# Patient Record
Sex: Female | Born: 1950
Health system: Southern US, Community
[De-identification: ages and names within clinical notes are randomized; demographics above are authoritative.]

## PROBLEM LIST (undated history)

## (undated) DIAGNOSIS — I2510 Atherosclerotic heart disease of native coronary artery without angina pectoris: Secondary | ICD-10-CM

## (undated) DIAGNOSIS — K5790 Diverticulosis of intestine, part unspecified, without perforation or abscess without bleeding: Secondary | ICD-10-CM

## (undated) DIAGNOSIS — B019 Varicella without complication: Secondary | ICD-10-CM

## (undated) DIAGNOSIS — E559 Vitamin D deficiency, unspecified: Secondary | ICD-10-CM

## (undated) DIAGNOSIS — I10 Essential (primary) hypertension: Secondary | ICD-10-CM

## (undated) DIAGNOSIS — Z972 Presence of dental prosthetic device (complete) (partial): Secondary | ICD-10-CM

## (undated) DIAGNOSIS — M1990 Unspecified osteoarthritis, unspecified site: Secondary | ICD-10-CM

## (undated) HISTORY — DX: Vitamin D deficiency, unspecified: E55.9

## (undated) HISTORY — DX: Diverticulosis of intestine, part unspecified, without perforation or abscess without bleeding: K57.90

## (undated) HISTORY — DX: Varicella without complication: B01.9

## (undated) HISTORY — DX: Atherosclerotic heart disease of native coronary artery without angina pectoris: I25.10

## (undated) HISTORY — PX: TUBAL LIGATION: SHX77

---

## 2012-03-16 LAB — LIPID PANEL
Cholesterol: 248 mg/dL — AB (ref 0–200)
HDL: 104 mg/dL — AB (ref 35–70)
LDL Cholesterol: 114 mg/dL
LDL/HDL RATIO: 2.4
Triglycerides: 149 mg/dL (ref 40–160)

## 2012-03-16 LAB — HM MAMMOGRAPHY: HM Mammogram: NORMAL

## 2012-03-16 LAB — HM COLONOSCOPY: HM COLON: NORMAL

## 2014-11-22 ENCOUNTER — Ambulatory Visit (INDEPENDENT_AMBULATORY_CARE_PROVIDER_SITE_OTHER): Payer: Medicare Other | Admitting: Nurse Practitioner

## 2014-11-22 ENCOUNTER — Encounter: Payer: Self-pay | Admitting: Nurse Practitioner

## 2014-11-22 ENCOUNTER — Encounter (INDEPENDENT_AMBULATORY_CARE_PROVIDER_SITE_OTHER): Payer: Self-pay

## 2014-11-22 VITALS — BP 134/80 | HR 64 | Temp 97.8°F | Resp 12 | Ht 62.0 in | Wt 160.0 lb

## 2014-11-22 DIAGNOSIS — Z418 Encounter for other procedures for purposes other than remedying health state: Secondary | ICD-10-CM

## 2014-11-22 DIAGNOSIS — Z23 Encounter for immunization: Secondary | ICD-10-CM

## 2014-11-22 DIAGNOSIS — Z0000 Encounter for general adult medical examination without abnormal findings: Secondary | ICD-10-CM

## 2014-11-22 DIAGNOSIS — Z1239 Encounter for other screening for malignant neoplasm of breast: Secondary | ICD-10-CM

## 2014-11-22 DIAGNOSIS — Z299 Encounter for prophylactic measures, unspecified: Secondary | ICD-10-CM

## 2014-11-22 LAB — TSH: TSH: 2.43 u[IU]/mL (ref 0.35–4.50)

## 2014-11-22 LAB — COMPREHENSIVE METABOLIC PANEL
ALK PHOS: 62 U/L (ref 39–117)
ALT: 14 U/L (ref 0–35)
AST: 17 U/L (ref 0–37)
Albumin: 4.3 g/dL (ref 3.5–5.2)
BILIRUBIN TOTAL: 0.4 mg/dL (ref 0.2–1.2)
BUN: 12 mg/dL (ref 6–23)
CO2: 27 mEq/L (ref 19–32)
Calcium: 9.6 mg/dL (ref 8.4–10.5)
Chloride: 106 mEq/L (ref 96–112)
Creatinine, Ser: 0.73 mg/dL (ref 0.40–1.20)
GFR: 84.95 mL/min (ref >60.00)
GLUCOSE: 86 mg/dL (ref 70–99)
Potassium: 4.2 mEq/L (ref 3.5–5.1)
Sodium: 139 mEq/L (ref 135–145)
TOTAL PROTEIN: 6.9 g/dL (ref 6.0–8.3)

## 2014-11-22 LAB — CBC WITH DIFFERENTIAL/PLATELET
BASOS ABS: 0 10*3/uL (ref 0.0–0.1)
Basophils Relative: 0.5 % (ref 0.0–3.0)
Eosinophils Absolute: 0.1 10*3/uL (ref 0.0–0.7)
Eosinophils Relative: 0.9 % (ref 0.0–5.0)
HCT: 40.6 % (ref 36.0–46.0)
HEMOGLOBIN: 14.1 g/dL (ref 12.0–15.0)
Lymphocytes Relative: 44.1 % (ref 12.0–46.0)
Lymphs Abs: 2.4 10*3/uL (ref 0.7–4.0)
MCHC: 34.8 g/dL (ref 30.0–36.0)
MCV: 87.5 fl (ref 78.0–100.0)
Monocytes Absolute: 0.5 10*3/uL (ref 0.1–1.0)
Monocytes Relative: 8.3 % (ref 3.0–12.0)
Neutro Abs: 2.5 10*3/uL (ref 1.4–7.7)
Neutrophils Relative %: 46.2 % (ref 43.0–77.0)
Platelets: 247 10*3/uL (ref 150.0–400.0)
RBC: 4.64 Mil/uL (ref 3.87–5.11)
RDW: 12.1 % (ref 11.5–15.5)
WBC: 5.5 10*3/uL (ref 4.0–10.5)

## 2014-11-22 LAB — HEMOGLOBIN A1C: HEMOGLOBIN A1C: 5.3 % (ref 4.6–6.5)

## 2014-11-22 LAB — LIPID PANEL
CHOLESTEROL: 217 mg/dL — AB (ref 0–200)
HDL: 86.5 mg/dL (ref >39.00)
LDL Cholesterol: 107 mg/dL — ABNORMAL HIGH (ref 0–99)
NonHDL: 130.5
TRIGLYCERIDES: 117 mg/dL (ref 0.0–149.0)
Total CHOL/HDL Ratio: 3
VLDL: 23.4 mg/dL (ref 0.0–40.0)

## 2014-11-22 NOTE — Assessment & Plan Note (Addendum)
Discussed acute and chronic issues. Reviewed health maintenance measures, PFSHx, and immunizations. Obtain routine labs TSH, Lipid panel, CBC w/ diff, A1c, and CMET. Pneumovax was given today. Pt unsure if needing welcome to medicare visit. Referral for mammogram. FU in 1 year or sooner.

## 2014-11-22 NOTE — Progress Notes (Signed)
Subjective:    Patient ID: Jill Arellano, female    DOB: 04/24/1951, 64 y.o.   MRN: [id number]  HPI  Ms. Jill Arellano is a 64 yo female establishing care.   1) Health Maintenance-   Diet- Cooks and goes out to eat, Cut down on pizza and red meat   Exercise- Walks when nice weather, most mornings 1 hour  Immunizations- Refused flu, would like pneumonia   Mammogram- 2013  Pap- Does not remember   Bone Density- Had approx 2011   Colonoscopy- 2013  PSA- N/A  Eye Exam- No up to date- last 2013  Dental Exam- 2013, due for visit   2) Chronic Problems-  Denies  3) Acute Problems-  Denies   Review of Systems  Constitutional: Positive for diaphoresis. Negative for fever, chills and fatigue.       Hot flashes  HENT: Negative for tinnitus and trouble swallowing.   Eyes: Negative for visual disturbance.  Respiratory: Negative for chest tightness, shortness of breath and wheezing.   Cardiovascular: Negative for chest pain, palpitations and leg swelling.  Gastrointestinal: Negative for nausea, vomiting, diarrhea, constipation and abdominal distention.  Genitourinary: Negative for dysuria.  Musculoskeletal: Negative for back pain and neck pain.  Skin: Negative for rash.  Allergic/Immunologic: Negative for environmental allergies and food allergies.  Neurological: Negative for dizziness, weakness, numbness and headaches.  Hematological: Does not bruise/bleed easily.  Psychiatric/Behavioral: Positive for sleep disturbance. Negative for suicidal ideas. The patient is not nervous/anxious.        Sleep habits changed due to menopause   Past Medical History  Diagnosis Date  . Chicken pox     History   Social History  . Marital Status: Divorced    Spouse Name: N/A    Number of Children: N/A  . Years of Education: N/A   Occupational History  . Not on file.   Social History Main Topics  . Smoking status: Former Games developermoker  . Smokeless tobacco: Former NeurosurgeonUser    Quit date: 08/10/1995  .  Alcohol Use: 0.6 oz/week    1 Not specified per week  . Drug Use: No  . Sexual Activity: Not Currently   Other Topics Concern  . 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  Family History  Problem Relation Age of Onset  . Heart disease Mother     CHF  . Cancer Sister     lung  . Cancer Brother     colon    No Known Allergies  No current outpatient prescriptions on file prior to visit.   No current facility-administered medications on file prior to visit.      Objective:   Physical Exam  Constitutional: She is oriented to person, place, and time. She appears well-developed and well-nourished. No distress.  HENT:  Head: Normocephalic and atraumatic.  Right Ear: External ear normal.  Left Ear: External ear normal.  Eyes: Conjunctivae and EOM are normal. Pupils are equal, round, and reactive to light. Right eye exhibits no discharge. Left eye exhibits no discharge. No scleral icterus.  Neck: Normal range of motion. Neck  supple. No thyromegaly present.  Cardiovascular: Normal rate, regular rhythm, normal heart sounds and intact distal pulses.  Exam reveals no gallop and no friction rub.   No murmur heard. Pulmonary/Chest: Effort normal and breath sounds normal. No respiratory distress. She has no wheezes. She has no rales. She exhibits no tenderness.  Abdominal: Soft. Bowel sounds are normal. She exhibits no distension and no mass. There is no tenderness. There is no rebound and no guarding.  Musculoskeletal: Normal range of motion. She exhibits no edema or tenderness.  Lymphadenopathy:    She has no cervical adenopathy.  Neurological: She is alert and oriented to person, place, and time. No cranial nerve deficit.  She exhibits normal muscle tone. Coordination normal.  Skin: Skin is warm and dry. No rash noted. She is not diaphoretic.  Psychiatric: She has a normal mood and affect. Her behavior is normal. Judgment and thought content normal.      Assessment & Plan:

## 2014-11-22 NOTE — Progress Notes (Signed)
Pre visit review using our clinic review tool, if applicable. No additional management support is needed unless otherwise documented below in the visit note. 

## 2014-11-22 NOTE — Addendum Note (Signed)
Addended by: Carollee LeitzSS, CARRIE M on: 11/22/2014 11:45 AM   Modules accepted: Level of Service

## 2014-11-22 NOTE — Patient Instructions (Signed)

## 2014-12-05 ENCOUNTER — Telehealth: Payer: Self-pay | Admitting: Nurse Practitioner

## 2014-12-05 NOTE — Telephone Encounter (Signed)
Pt request to have a Zostavax shot. Please advise if ok to schedule/msn

## 2014-12-05 NOTE — Telephone Encounter (Signed)
Yes okay to schedule.

## 2014-12-08 ENCOUNTER — Ambulatory Visit: Payer: Self-pay | Admitting: Nurse Practitioner

## 2014-12-08 ENCOUNTER — Other Ambulatory Visit: Payer: Self-pay | Admitting: *Deleted

## 2014-12-08 ENCOUNTER — Ambulatory Visit: Payer: Medicare Other

## 2014-12-08 MED ORDER — ZOSTER VACCINE LIVE 19400 UNT/0.65ML ~~LOC~~ SOLR: 0.6500 mL | Freq: Once | SUBCUTANEOUS | Status: DC

## 2016-01-31 ENCOUNTER — Encounter: Payer: Self-pay | Admitting: Nurse Practitioner

## 2016-01-31 ENCOUNTER — Ambulatory Visit (INDEPENDENT_AMBULATORY_CARE_PROVIDER_SITE_OTHER): Payer: Medicare PPO | Admitting: Nurse Practitioner

## 2016-01-31 VITALS — BP 148/64 | HR 72 | Temp 98.0°F | Ht 60.25 in | Wt 156.8 lb

## 2016-01-31 DIAGNOSIS — Z0000 Encounter for general adult medical examination without abnormal findings: Secondary | ICD-10-CM

## 2016-01-31 DIAGNOSIS — R51 Headache: Secondary | ICD-10-CM | POA: Diagnosis not present

## 2016-01-31 DIAGNOSIS — Z1239 Encounter for other screening for malignant neoplasm of breast: Secondary | ICD-10-CM | POA: Diagnosis not present

## 2016-01-31 DIAGNOSIS — R519 Headache, unspecified: Secondary | ICD-10-CM

## 2016-01-31 DIAGNOSIS — Z13820 Encounter for screening for osteoporosis: Secondary | ICD-10-CM

## 2016-01-31 NOTE — Patient Instructions (Addendum)
Once we get your labs back we can look at imaging as a possibility.   [facility] at [facility]  Address: [address] [place], [place] [address]  Phone: [phone] Hours:  Monday - Thursday: 8 a.m. - 5 p.m. Friday: 8 a.m. - 3 p.m.    Menopause is a normal process in which your reproductive ability comes to an end. This process happens gradually over a span of months to years, usually between the ages of 70 and 55. Menopause is complete when you have missed 12 consecutive menstrual periods. It is important to talk with your health care provider about some of the most common conditions that affect postmenopausal women, such as heart disease, cancer, and bone loss (osteoporosis). Adopting a healthy lifestyle and getting preventive care can help to promote your health and wellness. Those actions can also lower your chances of developing some of these common conditions. WHAT SHOULD I KNOW ABOUT MENOPAUSE? During menopause, you may experience a number of symptoms, such as:  Moderate-to-severe hot flashes.  Night sweats.  Decrease in sex drive.  Mood swings.  Headaches.  Tiredness.  Irritability.  Memory problems.  Insomnia. Choosing to treat or not to treat menopausal changes is an individual decision that you make with your health care provider. WHAT SHOULD I KNOW ABOUT HORMONE REPLACEMENT THERAPY AND SUPPLEMENTS? Hormone therapy products are effective for treating symptoms that are associated with menopause, such as hot flashes and night sweats. Hormone replacement carries certain risks, especially as you become older. If you are thinking about using estrogen or estrogen with progestin treatments, discuss the benefits and risks with your health care provider. WHAT SHOULD I KNOW ABOUT HEART DISEASE AND STROKE? Heart disease, heart attack, and stroke become more likely as you age. This may be due, in part, to the hormonal changes that your body experiences  during menopause. These can affect how your body processes dietary fats, triglycerides, and cholesterol. Heart attack and stroke are both medical emergencies. There are many things that you can do to help prevent heart disease and stroke:  Have your blood pressure checked at least every 1-2 years. High blood pressure causes heart disease and increases the risk of stroke.  If you are 37-76 years old, ask your health care provider if you should take aspirin to prevent a heart attack or a stroke.  Do not use any tobacco products, including cigarettes, chewing tobacco, or electronic cigarettes. If you need help quitting, ask your health care provider.  It is important to eat a healthy diet and maintain a healthy weight.  Be sure to include plenty of vegetables, fruits, low-fat dairy products, and lean protein.  Avoid eating foods that are high in solid fats, added sugars, or salt (sodium).  Get regular exercise. This is one of the most important things that you can do for your health.  Try to exercise for at least 150 minutes each week. The type of exercise that you do should increase your heart rate and make you sweat. This is known as moderate-intensity exercise.  Try to do strengthening exercises at least twice each week. Do these in addition to the moderate-intensity exercise.  Know your numbers.Ask your health care provider to check your cholesterol and your blood glucose. Continue to have your blood tested as directed by your health care provider. WHAT SHOULD I KNOW ABOUT CANCER SCREENING? There are several types of cancer. Take the following steps to reduce your risk and to catch any cancer development  as early as possible. Breast Cancer  Practice breast self-awareness.  This means understanding how your breasts normally appear and feel.  It also means doing regular breast self-exams. Let your health care provider know about any changes, no matter how small.  If you are 40 or  older, have a clinician do a breast exam (clinical breast exam or CBE) every year. Depending on your age, family history, and medical history, it may be recommended that you also have a yearly breast X-ray (mammogram).  If you have a family history of breast cancer, talk with your health care provider about genetic screening.  If you are at high risk for breast cancer, talk with your health care provider about having an MRI and a mammogram every year.  Breast cancer (BRCA) gene test is recommended for women who have family members with BRCA-related cancers. Results of the assessment will determine the need for genetic counseling and BRCA1 and for BRCA2 testing. BRCA-related cancers include these types:  Breast. This occurs in males or females.  Ovarian.  Tubal. This may also be called fallopian tube cancer.  Cancer of the abdominal or pelvic lining (peritoneal cancer).  Prostate.  Pancreatic. Cervical, Uterine, and Ovarian Cancer Your health care provider may recommend that you be screened regularly for cancer of the pelvic organs. These include your ovaries, uterus, and vagina. This screening involves a pelvic exam, which includes checking for microscopic changes to the surface of your cervix (Pap test).  For women ages 21-65, health care providers may recommend a pelvic exam and a Pap test every three years. For women ages 37-65, they may recommend the Pap test and pelvic exam, combined with testing for human papilloma virus (HPV), every five years. Some types of HPV increase your risk of cervical cancer. Testing for HPV may also be done on women of any age who have unclear Pap test results.  Other health care providers may not recommend any screening for nonpregnant women who are considered low risk for pelvic cancer and have no symptoms. Ask your health care provider if a screening pelvic exam is right for you.  If you have had past treatment for cervical cancer or a condition that  could lead to cancer, you need Pap tests and screening for cancer for at least 20 years after your treatment. If Pap tests have been discontinued for you, your risk factors (such as having a new sexual partner) need to be reassessed to determine if you should start having screenings again. Some women have medical problems that increase the chance of getting cervical cancer. In these cases, your health care provider may recommend that you have screening and Pap tests more often.  If you have a family history of uterine cancer or ovarian cancer, talk with your health care provider about genetic screening.  If you have vaginal bleeding after reaching menopause, tell your health care provider.  There are currently no reliable tests available to screen for ovarian cancer. Lung Cancer Lung cancer screening is recommended for adults 67-41 years old who are at high risk for lung cancer because of a history of smoking. A yearly low-dose CT scan of the lungs is recommended if you:  Currently smoke.  Have a history of at least 30 pack-years of smoking and you currently smoke or have quit within the past 15 years. A pack-year is smoking an average of one pack of cigarettes per day for one year. Yearly screening should:  Continue until it has been 15 years  since you quit.  Stop if you develop a health problem that would prevent you from having lung cancer treatment. Colorectal Cancer  This type of cancer can be detected and can often be prevented.  Routine colorectal cancer screening usually begins at age 67 and continues through age 52.  If you have risk factors for colon cancer, your health care provider may recommend that you be screened at an earlier age.  If you have a family history of colorectal cancer, talk with your health care provider about genetic screening.  Your health care provider may also recommend using home test kits to check for hidden blood in your stool.  A small camera at the  end of a tube can be used to examine your colon directly (sigmoidoscopy or colonoscopy). This is done to check for the earliest forms of colorectal cancer.  Direct examination of the colon should be repeated every 5-10 years until age 35. However, if early forms of precancerous polyps or small growths are found or if you have a family history or genetic risk for colorectal cancer, you may need to be screened more often. Skin Cancer  Check your skin from head to toe regularly.  Monitor any moles. Be sure to tell your health care provider:  About any new moles or changes in moles, especially if there is a change in a mole's shape or color.  If you have a mole that is larger than the size of a pencil eraser.  If any of your family members has a history of skin cancer, especially at a young age, talk with your health care provider about genetic screening.  Always use sunscreen. Apply sunscreen liberally and repeatedly throughout the day.  Whenever you are outside, protect yourself by wearing long sleeves, pants, a wide-brimmed hat, and sunglasses. WHAT SHOULD I KNOW ABOUT OSTEOPOROSIS? Osteoporosis is a condition in which bone destruction happens more quickly than new bone creation. After menopause, you may be at an increased risk for osteoporosis. To help prevent osteoporosis or the bone fractures that can happen because of osteoporosis, the following is recommended:  If you are 18-38 years old, get at least 1,000 mg of calcium and at least 600 mg of vitamin D per day.  If you are older than age 2 but younger than age 34, get at least 1,200 mg of calcium and at least 600 mg of vitamin D per day.  If you are older than age 15, get at least 1,200 mg of calcium and at least 800 mg of vitamin D per day. Smoking and excessive alcohol intake increase the risk of osteoporosis. Eat foods that are rich in calcium and vitamin D, and do weight-bearing exercises several times each week as directed by  your health care provider. WHAT SHOULD I KNOW ABOUT HOW MENOPAUSE AFFECTS MY MENTAL HEALTH? Depression may occur at any age, but it is more common as you become older. Common symptoms of depression include:  Low or sad mood.  Changes in sleep patterns.  Changes in appetite or eating patterns.  Feeling an overall lack of motivation or enjoyment of activities that you previously enjoyed.  Frequent crying spells. Talk with your health care provider if you think that you are experiencing depression. WHAT SHOULD I KNOW ABOUT IMMUNIZATIONS? It is important that you get and maintain your immunizations. These include:  Tetanus, diphtheria, and pertussis (Tdap) booster vaccine.  Influenza every year before the flu season begins.  Pneumonia vaccine.  Shingles vaccine. Your health care  provider may also recommend other immunizations.   This information is not intended to replace advice given to you by your health care provider. Make sure you discuss any questions you have with your health care provider.   Document Released: 11/22/2005 Document Revised: 10/21/2014 Document Reviewed: 06/02/2014 Elsevier Interactive Patient Education [employer].

## 2016-01-31 NOTE — Progress Notes (Signed)
Patient ID: Jill Arellano, female    DOB: 10-30-1950  Age: 65 y.o. MRN: [id number]  CC: Annual Exam   HPI [name] presents for Annual Exam.   1) Annual Physical   Diet- No Changes  Exercise- No Changes  Immunizations- Needs pna 13 (not feeling well will defer)  Mammogram- Needs ordered  Bone Density- Needs   Colonoscopy- UTD   Eye Exam- 1 month ago   Dental Exam- UTD  Labs- Fasting future labs  Fall- Neg.   Depression- Neg.  2) "Something going on in her head" x a month or so Feeling tingling sensation or pressure in different locations intermittently  Low grade headaches- constant tylenol helpful short term  Sister- brain cancer  Aunt- aneurysm  Happens different times a day and several times a week  "Just there". Feels inside of her brain  Intermittent blurred vision- normal eye exam 1 month prior  History Noam has a past medical history of Chicken pox.   She has no past surgical history on file.   Her family history includes Cancer in her brother and sister; Heart disease in her mother.She reports that she has quit smoking. She quit smokeless tobacco use about 20 years ago. She reports that she drinks about 0.6 oz of alcohol per week. She reports that she does not use illicit drugs.  Outpatient Prescriptions Prior to Visit  Medication Sig Dispense Refill  . zoster vaccine live, PF, (ZOSTAVAX) 30160 UNT/0.65ML injection Inject 19,400 Units into the skin once. 1 each 0   No facility-administered medications prior to visit.    ROS Review of Systems  Constitutional: Negative for fever, chills, diaphoresis, fatigue and unexpected weight change.  HENT: Negative for tinnitus and trouble swallowing.   Eyes: Negative for visual disturbance.  Respiratory: Negative for chest tightness, shortness of breath and wheezing.   Cardiovascular: Negative for chest pain, palpitations and leg swelling.  Gastrointestinal: Negative for nausea, vomiting, abdominal pain, diarrhea,  constipation and blood in stool.  Endocrine: Negative for polydipsia, polyphagia and polyuria.  Genitourinary: Negative for dysuria, hematuria, vaginal discharge and vaginal pain.  Musculoskeletal: Negative for myalgias, back pain, arthralgias and gait problem.  Skin: Negative for color change and rash.  Neurological: Negative for dizziness, weakness, numbness and headaches.  Hematological: Does not bruise/bleed easily.  Psychiatric/Behavioral: Negative for suicidal ideas and sleep disturbance. The patient is not nervous/anxious.     Objective:  BP 148/64 mmHg  Pulse 72  Temp(Src) 98 F (36.7 C) (Oral)  Ht 5' 0.25" (1.53 m)  Wt 156 lb 12.8 oz (71.124 kg)  BMI 30.38 kg/m2  SpO2 97%  Physical Exam  Constitutional: She is oriented to person, place, and time. She appears well-developed and well-nourished. No distress.  HENT:  Head: Normocephalic and atraumatic.  Right Ear: External ear normal.  Left Ear: External ear normal.  Nose: Nose normal.  Mouth/Throat: Oropharynx is clear and moist. No oropharyngeal exudate.  TMs and canals clear bilaterally  Eyes: Conjunctivae and EOM are normal. Pupils are equal, round, and reactive to light. Right eye exhibits no discharge. Left eye exhibits no discharge. No scleral icterus.  Neck: Normal range of motion. Neck supple. No thyromegaly present.  Cardiovascular: Normal rate, regular rhythm, normal heart sounds and intact distal pulses.  Exam reveals no gallop and no friction rub.   No murmur heard. Pulmonary/Chest: Effort normal and breath sounds normal. No respiratory distress. She has no wheezes. She has no rales. She exhibits no tenderness.  Abdominal: Soft. Bowel sounds are  normal. She exhibits no distension and no mass. There is no tenderness. There is no rebound and no guarding.  Musculoskeletal: Normal range of motion. She exhibits no edema or tenderness.  Lymphadenopathy:    She has no cervical adenopathy.  Neurological: She is alert  and oriented to person, place, and time. She has normal reflexes. No cranial nerve deficit. She exhibits normal muscle tone. Coordination normal.  Skin: Skin is warm and dry. No rash noted. She is not diaphoretic. No erythema. No pallor.  Psychiatric: She has a normal mood and affect. Her behavior is normal. Judgment and thought content normal.   Assessment & Plan:   Kimberli was seen today for annual exam.  Diagnoses and all orders for this visit:  Screening for breast cancer -     MM Digital Screening; Future  Routine general medical examination at a health care facility -     CBC with Differential/Platelet; Future -     Comp Met (CMET); Future -     Lipid Profile; Future -     HgB A1c; Future  Generalized headaches -     Sed Rate (ESR); Future -     B12; Future  Screening for osteoporosis -     DG Bone Density; Future   I have discontinued Ms. Pae's zoster vaccine live (PF).  No orders of the defined types were placed in this encounter.     Follow-up: Return in about 1 year (around 01/30/2017) for CPE w/ labs.

## 2016-02-08 ENCOUNTER — Other Ambulatory Visit (INDEPENDENT_AMBULATORY_CARE_PROVIDER_SITE_OTHER): Payer: Medicare PPO

## 2016-02-08 DIAGNOSIS — R51 Headache: Secondary | ICD-10-CM

## 2016-02-08 DIAGNOSIS — R519 Headache, unspecified: Secondary | ICD-10-CM

## 2016-02-08 DIAGNOSIS — Z0000 Encounter for general adult medical examination without abnormal findings: Secondary | ICD-10-CM | POA: Diagnosis not present

## 2016-02-08 LAB — COMPREHENSIVE METABOLIC PANEL
ALT: 17 U/L (ref 0–35)
AST: 19 U/L (ref 0–37)
Albumin: 4.4 g/dL (ref 3.5–5.2)
Alkaline Phosphatase: 53 U/L (ref 39–117)
BILIRUBIN TOTAL: 0.4 mg/dL (ref 0.2–1.2)
BUN: 11 mg/dL (ref 6–23)
CALCIUM: 9.5 mg/dL (ref 8.4–10.5)
CO2: 29 meq/L (ref 19–32)
Chloride: 104 mEq/L (ref 96–112)
Creatinine, Ser: 0.66 mg/dL (ref 0.40–1.20)
GFR: 95.07 mL/min (ref >60.00)
GLUCOSE: 86 mg/dL (ref 70–99)
POTASSIUM: 4.3 meq/L (ref 3.5–5.1)
Sodium: 140 mEq/L (ref 135–145)
Total Protein: 7.1 g/dL (ref 6.0–8.3)

## 2016-02-08 LAB — LIPID PANEL
CHOL/HDL RATIO: 2
Cholesterol: 233 mg/dL — ABNORMAL HIGH (ref 0–200)
HDL: 105.4 mg/dL (ref >39.00)
LDL Cholesterol: 112 mg/dL — ABNORMAL HIGH (ref 0–99)
NONHDL: 127.16
TRIGLYCERIDES: 77 mg/dL (ref 0.0–149.0)
VLDL: 15.4 mg/dL (ref 0.0–40.0)

## 2016-02-08 LAB — CBC WITH DIFFERENTIAL/PLATELET
BASOS ABS: 0 10*3/uL (ref 0.0–0.1)
Basophils Relative: 0.4 % (ref 0.0–3.0)
EOS ABS: 0 10*3/uL (ref 0.0–0.7)
Eosinophils Relative: 0.7 % (ref 0.0–5.0)
HEMATOCRIT: 41 % (ref 36.0–46.0)
HEMOGLOBIN: 13.7 g/dL (ref 12.0–15.0)
LYMPHS PCT: 40.9 % (ref 12.0–46.0)
Lymphs Abs: 2.6 10*3/uL (ref 0.7–4.0)
MCHC: 33.5 g/dL (ref 30.0–36.0)
MCV: 90.5 fl (ref 78.0–100.0)
MONOS PCT: 6.8 % (ref 3.0–12.0)
Monocytes Absolute: 0.4 10*3/uL (ref 0.1–1.0)
NEUTROS ABS: 3.2 10*3/uL (ref 1.4–7.7)
Neutrophils Relative %: 51.2 % (ref 43.0–77.0)
PLATELETS: 247 10*3/uL (ref 150.0–400.0)
RBC: 4.53 Mil/uL (ref 3.87–5.11)
RDW: 12.3 % (ref 11.5–15.5)
WBC: 6.3 10*3/uL (ref 4.0–10.5)

## 2016-02-08 LAB — HEMOGLOBIN A1C: Hgb A1c MFr Bld: 5.4 % (ref 4.6–6.5)

## 2016-02-08 LAB — VITAMIN B12: VITAMIN B 12: 345 pg/mL (ref 211–911)

## 2016-02-08 LAB — SEDIMENTATION RATE: SED RATE: 4 mm/h (ref 0–22)

## 2016-02-10 DIAGNOSIS — R51 Headache: Secondary | ICD-10-CM

## 2016-02-10 DIAGNOSIS — Z13820 Encounter for screening for osteoporosis: Secondary | ICD-10-CM | POA: Insufficient documentation

## 2016-02-10 DIAGNOSIS — R519 Headache, unspecified: Secondary | ICD-10-CM | POA: Insufficient documentation

## 2016-02-10 DIAGNOSIS — Z1239 Encounter for other screening for malignant neoplasm of breast: Secondary | ICD-10-CM | POA: Insufficient documentation

## 2016-02-10 NOTE — Assessment & Plan Note (Signed)
Discussed acute and chronic issues. Reviewed health maintenance measures, PFSHx, and immunizations. Obtain routine labs Lipid panel, CBC w/ diff, A1c, and CMET. Add B12, and ESR  HM made UTD today Defer Prevnar 13 at this time

## 2016-02-10 NOTE — Assessment & Plan Note (Signed)
DXA ordered

## 2016-02-10 NOTE — Assessment & Plan Note (Signed)
Pt is concerned with new onset strange sensations in head. Sounds like pt is concerned about family history of brain diseases/disorders. Will check labs and discussed possible imaging in the future.

## 2016-02-10 NOTE — Assessment & Plan Note (Signed)
Mammogram ordered

## 2016-02-14 ENCOUNTER — Telehealth: Payer: Self-pay | Admitting: Nurse Practitioner

## 2016-02-14 NOTE — Telephone Encounter (Signed)
Please advise this patients request.

## 2016-02-14 NOTE — Telephone Encounter (Signed)
Pt called about still having low grade headaches. Pt thought that after the B12 the low grade headache would stop, but it has not. Pt stated that Jill Arellano stated she could get a MRI to scan the brain. Call pt @ [phone] or her Mychart. Thank you!

## 2016-02-15 ENCOUNTER — Other Ambulatory Visit: Payer: Self-pay | Admitting: Nurse Practitioner

## 2016-02-15 DIAGNOSIS — R519 Headache, unspecified: Secondary | ICD-10-CM

## 2016-02-15 DIAGNOSIS — R51 Headache: Principal | ICD-10-CM

## 2016-02-15 NOTE — Telephone Encounter (Signed)
Ordered. Thanks

## 2016-02-20 ENCOUNTER — Ambulatory Visit
Admission: RE | Admit: 2016-02-20 | Discharge: 2016-02-20 | Disposition: A | Discharge status: Home / Self Care | Payer: Medicare PPO | Source: Ambulatory Visit | Attending: Nurse Practitioner | Admitting: Nurse Practitioner

## 2016-02-20 ENCOUNTER — Other Ambulatory Visit: Payer: Self-pay | Admitting: Nurse Practitioner

## 2016-02-20 DIAGNOSIS — Z13820 Encounter for screening for osteoporosis: Secondary | ICD-10-CM | POA: Diagnosis not present

## 2016-02-20 DIAGNOSIS — Z1239 Encounter for other screening for malignant neoplasm of breast: Secondary | ICD-10-CM

## 2016-02-20 DIAGNOSIS — M85852 Other specified disorders of bone density and structure, left thigh: Secondary | ICD-10-CM | POA: Insufficient documentation

## 2016-02-20 DIAGNOSIS — M8588 Other specified disorders of bone density and structure, other site: Secondary | ICD-10-CM | POA: Diagnosis not present

## 2016-02-20 DIAGNOSIS — Z1231 Encounter for screening mammogram for malignant neoplasm of breast: Secondary | ICD-10-CM | POA: Insufficient documentation

## 2016-03-04 ENCOUNTER — Ambulatory Visit
Admission: RE | Admit: 2016-03-04 | Discharge: 2016-03-04 | Disposition: A | Discharge status: Home / Self Care | Payer: Medicare PPO | Source: Ambulatory Visit | Attending: Nurse Practitioner | Admitting: Nurse Practitioner

## 2016-03-04 DIAGNOSIS — R51 Headache: Secondary | ICD-10-CM | POA: Insufficient documentation

## 2016-03-04 DIAGNOSIS — R519 Headache, unspecified: Secondary | ICD-10-CM

## 2016-03-07 ENCOUNTER — Telehealth: Payer: Self-pay | Admitting: Nurse Practitioner

## 2016-03-07 NOTE — Telephone Encounter (Signed)
Pt called returning call. Looks like for results for imaging.  Call pt @ [phone]. Thank you!

## 2016-03-07 NOTE — Telephone Encounter (Signed)
Patient is aware of result

## 2016-12-24 ENCOUNTER — Ambulatory Visit: Payer: Medicare PPO

## 2016-12-27 ENCOUNTER — Ambulatory Visit (INDEPENDENT_AMBULATORY_CARE_PROVIDER_SITE_OTHER): Payer: Medicare PPO

## 2016-12-27 VITALS — BP 138/80 | HR 71 | Temp 97.9°F | Resp 14 | Ht 60.0 in | Wt 157.0 lb

## 2016-12-27 DIAGNOSIS — Z0000 Encounter for general adult medical examination without abnormal findings: Secondary | ICD-10-CM

## 2016-12-27 DIAGNOSIS — Z23 Encounter for immunization: Secondary | ICD-10-CM | POA: Diagnosis not present

## 2016-12-27 NOTE — Progress Notes (Signed)
Care was provided under my supervision. I agree with the management as indicated in the note.  Tavonna Worthington DO  

## 2016-12-27 NOTE — Progress Notes (Signed)
Subjective:   Jill Arellano is a 66 y.o. female who presents for an Initial Medicare Annual Wellness Visit.  Review of Systems    No ROS.  Medicare Wellness Visit.  Cardiac Risk Factors include: advanced age (>76men, >43 women)     Objective:    Today's Vitals   12/27/16 0916  BP: 138/80  Pulse: 71  Resp: 14  Temp: 97.9 F (36.6 C)  TempSrc: Oral  SpO2: 96%  Weight: 157 lb (71.2 kg)  Height: 5' (1.524 m)   Body mass index is 30.66 kg/m.   Current Medications (verified) No outpatient encounter prescriptions on file as of 12/27/2016.   No facility-administered encounter medications on file as of 12/27/2016.     Allergies (verified) Patient has no known allergies.   History: Past Medical History:  Diagnosis Date  . Chicken pox    History reviewed. No pertinent surgical history. Family History  Problem Relation Age of Onset  . Heart disease Mother     CHF  . Cancer Sister     lung  . Cancer Brother     colon   Social History   Occupational History  . Not on file.   Social History Main Topics  . Smoking status: Former Games developer  . Smokeless tobacco: Former Neurosurgeon    Quit date: 08/10/1995  . Alcohol use 0.6 oz/week    1 Standard drinks or equivalent per week     Comment: beer OCC  . Drug use: No  . Sexual activity: No    Tobacco Counseling Counseling given: Not Answered   Activities of Daily Living In your present state of health, do you have any difficulty performing the following activities: 12/27/2016  Hearing? N  Vision? N  Difficulty concentrating or making decisions? N  Walking or climbing stairs? N  Dressing or bathing? N  Doing errands, shopping? N  Preparing Food and eating ? N  Using the Toilet? N  In the past six months, have you accidently leaked urine? Y  Do you have problems with loss of bowel control? N  Managing your Medications? N  Managing your Finances? N  Housekeeping or managing your Housekeeping? N  Some recent data  might be hidden    Immunizations and Health Maintenance Immunization History  Administered Date(s) Administered  . Influenza Split 08/28/2010  . Pneumococcal Conjugate-13 12/27/2016  . Pneumococcal Polysaccharide-23 11/22/2014  . Tdap 01/28/2011   Health Maintenance Due  Topic Date Due  . Hepatitis C Screening  April 03, 1951  . PNA vac Low Risk Adult (2 of 2 - PCV13) 11/23/2015    Patient Care Team: Allegra Grana, FNP as PCP - General (Family Medicine)  Indicate any recent Medical Services you may have received from other than [facility] providers in the past year (date may be approximate).     Assessment:   This is a routine wellness examination for Jill Arellano.  The goal of the wellness visit is to assist the patient how to close the gaps in care and create a preventative care plan for the patient.   Osteoporosis risk reviewed.  Medications reviewed; taking without issues or barriers.  Safety issues reviewed; smoke detectors in the home. No firearms in the home.  Wears seatbelts when driving or riding with others. Patient does wear sunscreen or protective clothing when in direct sunlight. No violence in the home.  Patient is alert, normal appearance, oriented to person/place/and time. Correctly identified the president of the [place], recall of 3/3 words, and performing simple  calculations.  Patient displays appropriate judgement and can read correct time from watch face.  No new identified risk were noted.  No failures at ADL's or IADL's.   BMI- discussed the importance of a healthy diet, water intake and exercise. Educational material provided.   Dental- Wears dentures.  Sleep patterns- Sleeps 5-6 hours at night.  Wakes feeling rested.  Prevnar 13 vaccine administered L deltoid, tolerated well. Educational material provided.  Prevnar 13 vaccine and Hepatitis C screening discussed; educational material provided.  Deferred per patient preference.  Health maintenance gaps-  closed.  Patient Concerns: None at this time. Follow up with PCP as needed.  Hearing/Vision screen Hearing Screening Comments: Patient is able to hear conversational tones without difficulty.  No issues reported.   Vision Screening Comments: Followed by [facility] Wears corrective lenses Last OV 03/2016 Visual acuity not assessed per patient preference since they have regular follow up with the ophthalmologist  Dietary issues and exercise activities discussed: Current Exercise Habits: Home exercise routine, Type of exercise: walking (Waitressing), Time (Minutes): 60, Frequency (Times/Week): 6, Weekly Exercise (Minutes/Week): 360, Intensity: Intense  Goals    . Increase water intake          Stay hydrated      Depression Screen PHQ 2/9 Scores 12/27/2016 01/31/2016 11/22/2014  PHQ - 2 Score 0 0 0    Fall Risk Fall Risk  12/27/2016 01/31/2016 11/22/2014  Falls in the past year? No No No    Cognitive Function: MMSE - Mini Mental State Exam 12/27/2016  Orientation to time 5  Orientation to Place 5  Registration 3  Attention/ Calculation 5  Recall 3  Language- name 2 objects 2  Language- repeat 1  Language- follow 3 step command 3  Language- read & follow direction 1  Write a sentence 1  Copy design 1  Total score 30        Screening Tests Health Maintenance  Topic Date Due  . Hepatitis C Screening  04-04-1951  . PNA vac Low Risk Adult (2 of 2 - PCV13) 11/23/2015  . INFLUENZA VACCINE  01/11/2017 (Originally 05/14/2016)  . MAMMOGRAM  02/19/2018  . TETANUS/TDAP  01/27/2021  . COLONOSCOPY  03/16/2022  . DEXA SCAN  Addressed      Plan:   End of life planning; Advanced aging; Advanced directives discussed.  No HCPOA/Living Will.  Additional information provided to help them start the conversation with family.  Copy of HCPOA/Living Will requested upon completion. Time spent on this topic is 25 minutes.  Medicare Attestation I have personally reviewed: The patient's  medical and social history Their use of alcohol, tobacco or illicit drugs Their current medications and supplements The patient's functional ability including ADLs,fall risks, home safety risks, cognitive, and hearing and visual impairment Diet and physical activities Evidence for depression   The patient's weight, height, BMI, and visual acuity have been recorded in the chart.  I have made referrals and provided education to the patient based on review of the above and I have provided the patient with a written personalized care plan for preventive services.    During the course of the visit, Heran was educated and counseled about the following appropriate screening and preventive services:   Vaccines to include Pneumoccal, Influenza, Hepatitis B, Td, Zostavax, HCV  Colorectal cancer screening-UTD  Bone density screening-UTD  Glaucoma screening-annual eye exam  Mammography-UTD  Nutrition counseling  Patient Instructions (the written plan) were given to the patient.    Maryruth Bun  L, LPN   [id number]/61/09603/16/2018

## 2016-12-27 NOTE — Patient Instructions (Addendum)
  [name] , Thank you for taking time to come for your Medicare Wellness Visit. I appreciate your ongoing commitment to your health goals. Please review the following plan we discussed and let me know if I can assist you in the future.   Follow up with Rennie PlowmanMargaret Arnett, FNP as needed.    Bring a copy of your Health Care Power of Attorney and/or Living Will to be scanned into chart.  Have a great day!  These are the goals we discussed: Goals    . Increase water intake          Stay hydrated       This is a list of the screening recommended for you and due dates:  Health Maintenance  Topic Date Due  .  Hepatitis C: One time screening is recommended by Center for Disease Control  (CDC) for  adults born from [birth year] through 1967.   September 23, 1951  . Pneumonia vaccines (2 of 2 - PCV13) 11/23/2015  . Flu Shot  01/11/2017*  . Mammogram  02/19/2018  . Tetanus Vaccine  01/27/2021  . Colon Cancer Screening  03/16/2022  . DEXA scan (bone density measurement)  Addressed  *Topic was postponed. The date shown is not the original due date.

## 2017-05-13 ENCOUNTER — Other Ambulatory Visit (HOSPITAL_COMMUNITY)
Admission: RE | Admit: 2017-05-13 | Discharge: 2017-05-13 | Disposition: A | Discharge status: Home / Self Care | Payer: Medicare PPO | Source: Ambulatory Visit | Attending: Family | Admitting: Family

## 2017-05-13 ENCOUNTER — Encounter: Payer: Self-pay | Admitting: Family

## 2017-05-13 ENCOUNTER — Ambulatory Visit (INDEPENDENT_AMBULATORY_CARE_PROVIDER_SITE_OTHER): Payer: Medicare PPO | Admitting: Family

## 2017-05-13 VITALS — BP 120/66 | HR 65 | Temp 98.0°F | Ht 60.0 in | Wt 156.6 lb

## 2017-05-13 DIAGNOSIS — Z0000 Encounter for general adult medical examination without abnormal findings: Secondary | ICD-10-CM | POA: Insufficient documentation

## 2017-05-13 DIAGNOSIS — M67432 Ganglion, left wrist: Secondary | ICD-10-CM | POA: Diagnosis not present

## 2017-05-13 DIAGNOSIS — M25542 Pain in joints of left hand: Secondary | ICD-10-CM | POA: Diagnosis not present

## 2017-05-13 NOTE — Progress Notes (Signed)
Pre visit review using our clinic review tool, if applicable. No additional management support is needed unless otherwise documented below in the visit note. 

## 2017-05-13 NOTE — Patient Instructions (Addendum)
Pleasure seeing you today  Doristine Devoid work with staying so healthy!  Fasting Labs when able  We placed a referral. Mammogram this year. I asked that you call one the below locations and schedule this when it is convenient for you.   If you have dense breasts, you may ask for 3D mammogram over the traditional 2D mammogram as new evidence suggest 3D is superior. Please note that NOT all insurance companies cover 3D and you may have to pay a higher copay. You may call your insurance company to further clarify your benefits.   Options for [place]  [place], [place]  * [facility] [place], [place] * Note if you ask for 3D mammogram at this location, you must request [place], [place] location*       Health Maintenance for Postmenopausal Women Menopause is a normal process in which your reproductive ability comes to an end. This process happens gradually over a span of months to years, usually between the ages of 36 and 3. Menopause is complete when you have missed 12 consecutive menstrual periods. It is important to talk with your health care provider about some of the most common conditions that affect postmenopausal women, such as heart disease, cancer, and bone loss (osteoporosis). Adopting a healthy lifestyle and getting preventive care can help to promote your health and wellness. Those actions can also lower your chances of developing some of these common conditions. What should I know about menopause? During menopause, you may experience a number of symptoms, such as:  Moderate-to-severe hot flashes.  Night sweats.  Decrease in sex drive.  Mood swings.  Headaches.  Tiredness.  Irritability.  Memory problems.  Insomnia.  Choosing to treat or not to treat menopausal changes is an individual decision that you make with  your health care provider. What should I know about hormone replacement therapy and supplements? Hormone therapy products are effective for treating symptoms that are associated with menopause, such as hot flashes and night sweats. Hormone replacement carries certain risks, especially as you become older. If you are thinking about using estrogen or estrogen with progestin treatments, discuss the benefits and risks with your health care provider. What should I know about heart disease and stroke? Heart disease, heart attack, and stroke become more likely as you age. This may be due, in part, to the hormonal changes that your body experiences during menopause. These can affect how your body processes dietary fats, triglycerides, and cholesterol. Heart attack and stroke are both medical emergencies. There are many things that you can do to help prevent heart disease and stroke:  Have your blood pressure checked at least every 1-2 years. High blood pressure causes heart disease and increases the risk of stroke.  If you are 73-74 years old, ask your health care provider if you should take aspirin to prevent a heart attack or a stroke.  Do not use any tobacco products, including cigarettes, chewing tobacco, or electronic cigarettes. If you need help quitting, ask your health care provider.  It is important to eat a healthy diet and maintain a healthy weight. ? Be sure to include plenty of vegetables, fruits, low-fat dairy products, and lean protein. ? Avoid eating foods that are high in solid fats, added sugars, or salt (sodium).  Get regular exercise. This is one of the most important things that you  can do for your health. ? Try to exercise for at least 150 minutes each week. The type of exercise that you do should increase your heart rate and make you sweat. This is known as moderate-intensity exercise. ? Try to do strengthening exercises at least twice each week. Do these in addition to the  moderate-intensity exercise.  Know your numbers.Ask your health care provider to check your cholesterol and your blood glucose. Continue to have your blood tested as directed by your health care provider.  What should I know about cancer screening? There are several types of cancer. Take the following steps to reduce your risk and to catch any cancer development as early as possible. Breast Cancer  Practice breast self-awareness. ? This means understanding how your breasts normally appear and feel. ? It also means doing regular breast self-exams. Let your health care provider know about any changes, no matter how small.  If you are 30 or older, have a clinician do a breast exam (clinical breast exam or CBE) every year. Depending on your age, family history, and medical history, it may be recommended that you also have a yearly breast X-ray (mammogram).  If you have a family history of breast cancer, talk with your health care provider about genetic screening.  If you are at high risk for breast cancer, talk with your health care provider about having an MRI and a mammogram every year.  Breast cancer (BRCA) gene test is recommended for women who have family members with BRCA-related cancers. Results of the assessment will determine the need for genetic counseling and BRCA1 and for BRCA2 testing. BRCA-related cancers include these types: ? Breast. This occurs in males or females. ? Ovarian. ? Tubal. This may also be called fallopian tube cancer. ? Cancer of the abdominal or pelvic lining (peritoneal cancer). ? Prostate. ? Pancreatic.  Cervical, Uterine, and Ovarian Cancer Your health care provider may recommend that you be screened regularly for cancer of the pelvic organs. These include your ovaries, uterus, and vagina. This screening involves a pelvic exam, which includes checking for microscopic changes to the surface of your cervix (Pap test).  For women ages 21-65, health care  providers may recommend a pelvic exam and a Pap test every three years. For women ages 82-65, they may recommend the Pap test and pelvic exam, combined with testing for human papilloma virus (HPV), every five years. Some types of HPV increase your risk of cervical cancer. Testing for HPV may also be done on women of any age who have unclear Pap test results.  Other health care providers may not recommend any screening for nonpregnant women who are considered low risk for pelvic cancer and have no symptoms. Ask your health care provider if a screening pelvic exam is right for you.  If you have had past treatment for cervical cancer or a condition that could lead to cancer, you need Pap tests and screening for cancer for at least 20 years after your treatment. If Pap tests have been discontinued for you, your risk factors (such as having a new sexual partner) need to be reassessed to determine if you should start having screenings again. Some women have medical problems that increase the chance of getting cervical cancer. In these cases, your health care provider may recommend that you have screening and Pap tests more often.  If you have a family history of uterine cancer or ovarian cancer, talk with your health care provider about genetic screening.  If you  have vaginal bleeding after reaching menopause, tell your health care provider.  There are currently no reliable tests available to screen for ovarian cancer.  Lung Cancer Lung cancer screening is recommended for adults 38-42 years old who are at high risk for lung cancer because of a history of smoking. A yearly low-dose CT scan of the lungs is recommended if you:  Currently smoke.  Have a history of at least 30 pack-years of smoking and you currently smoke or have quit within the past 15 years. A pack-year is smoking an average of one pack of cigarettes per day for one year.  Yearly screening should:  Continue until it has been 15 years  since you quit.  Stop if you develop a health problem that would prevent you from having lung cancer treatment.  Colorectal Cancer  This type of cancer can be detected and can often be prevented.  Routine colorectal cancer screening usually begins at age 100 and continues through age 33.  If you have risk factors for colon cancer, your health care provider may recommend that you be screened at an earlier age.  If you have a family history of colorectal cancer, talk with your health care provider about genetic screening.  Your health care provider may also recommend using home test kits to check for hidden blood in your stool.  A small camera at the end of a tube can be used to examine your colon directly (sigmoidoscopy or colonoscopy). This is done to check for the earliest forms of colorectal cancer.  Direct examination of the colon should be repeated every 5-10 years until age 43. However, if early forms of precancerous polyps or small growths are found or if you have a family history or genetic risk for colorectal cancer, you may need to be screened more often.  Skin Cancer  Check your skin from head to toe regularly.  Monitor any moles. Be sure to tell your health care provider: ? About any new moles or changes in moles, especially if there is a change in a mole's shape or color. ? If you have a mole that is larger than the size of a pencil eraser.  If any of your family members has a history of skin cancer, especially at a young age, talk with your health care provider about genetic screening.  Always use sunscreen. Apply sunscreen liberally and repeatedly throughout the day.  Whenever you are outside, protect yourself by wearing long sleeves, pants, a wide-brimmed hat, and sunglasses.  What should I know about osteoporosis? Osteoporosis is a condition in which bone destruction happens more quickly than new bone creation. After menopause, you may be at an increased risk for  osteoporosis. To help prevent osteoporosis or the bone fractures that can happen because of osteoporosis, the following is recommended:  If you are 11-20 years old, get at least 1,000 mg of calcium and at least 600 mg of vitamin D per day.  If you are older than age 27 but younger than age 71, get at least 1,200 mg of calcium and at least 600 mg of vitamin D per day.  If you are older than age 29, get at least 1,200 mg of calcium and at least 800 mg of vitamin D per day.  Smoking and excessive alcohol intake increase the risk of osteoporosis. Eat foods that are rich in calcium and vitamin D, and do weight-bearing exercises several times each week as directed by your health care provider. What should I know  about how menopause affects my mental health? Depression may occur at any age, but it is more common as you become older. Common symptoms of depression include:  Low or sad mood.  Changes in sleep patterns.  Changes in appetite or eating patterns.  Feeling an overall lack of motivation or enjoyment of activities that you previously enjoyed.  Frequent crying spells.  Talk with your health care provider if you think that you are experiencing depression. What should I know about immunizations? It is important that you get and maintain your immunizations. These include:  Tetanus, diphtheria, and pertussis (Tdap) booster vaccine.  Influenza every year before the flu season begins.  Pneumonia vaccine.  Shingles vaccine.  Your health care provider may also recommend other immunizations. This information is not intended to replace advice given to you by your health care provider. Make sure you discuss any questions you have with your health care provider. Document Released: 11/22/2005 Document Revised: 04/19/2016 Document Reviewed: 07/04/2015 Elsevier Interactive Patient Education  2018 [employer].

## 2017-05-13 NOTE — Assessment & Plan Note (Addendum)
CBE and pap performed. No pap in 10 years. We'll decide at next annual physical if she will continue Pap smears as long as this one was normal. Screening labs ordered. CT chest as former smoker. Mammogram ordered and patient understands to schedule.

## 2017-05-13 NOTE — Progress Notes (Signed)
Subjective:    Patient ID: Jill Arellano, female    DOB: 03/23/1951, 66 y.o.   MRN: [id number]  CC: Jill Arellano is a 66 y.o. female who presents today for physical exam.    HPI: Feeling well. No complaints      Colorectal Cancer Screening: UTD 5 years ago, repeat on 10 years Breast Cancer Screening: Mammogram due Cervical Cancer Screening: No h/o abnormal pap. No pelvic, vaginal bleeding, dyspareunia Bone Health screening/DEXA for 65+: 2017 DEXA showed osteopenia. Lung Cancer Screening: Smoked for more than 30 years.   Immunizations       Tetanus - utd        Pneumococcal - Complete Hepatitis C screening - Candidate for consents  Labs: Screening labs today. Exercise: Gets regular exercise.  Alcohol use: rare Smoking/tobacco use: former smoker.  Regular dental exams: In need of dental exam. Wears seat belt: Yes. Skin: has an appt with a dermatologist- August for concerning lesions on back.   HISTORY:  Past Medical History:  Diagnosis Date  . Chicken pox     History reviewed. No pertinent surgical history. Family History  Problem Relation Age of Onset  . Heart disease Mother        CHF  . Cancer Sister        lung  . Cancer Brother        colon      ALLERGIES: Patient has no known allergies.  No current outpatient prescriptions on file prior to visit.   No current facility-administered medications on file prior to visit.     Social History  Substance Use Topics  . Smoking status: Former Games developermoker  . Smokeless tobacco: Former NeurosurgeonUser    Quit date: 08/10/1995  . Alcohol use 0.6 oz/week    1 Standard drinks or equivalent per week     Comment: beer OCC    Review of Systems  Constitutional: Negative for chills, fever and unexpected weight change.  HENT: Negative for congestion.   Respiratory: Negative for cough.   Cardiovascular: Negative for chest pain, palpitations and leg swelling.  Gastrointestinal: Negative for nausea and vomiting.    Musculoskeletal: Negative for arthralgias and myalgias.  Skin: Negative for rash.  Neurological: Negative for headaches.  Hematological: Negative for adenopathy.  Psychiatric/Behavioral: Negative for confusion.      Objective:    BP 120/66   Pulse 65   Temp 98 F (36.7 C) (Oral)   Ht 5' (1.524 m)   Wt 156 lb 9.6 oz (71 kg)   SpO2 97%   BMI 30.58 kg/m   BP Readings from Last 3 Encounters:  05/13/17 120/66  12/27/16 138/80  01/31/16 (!) 148/64   Wt Readings from Last 3 Encounters:  05/13/17 156 lb 9.6 oz (71 kg)  12/27/16 157 lb (71.2 kg)  01/31/16 156 lb 12.8 oz (71.1 kg)    Physical Exam  Constitutional: She appears well-developed and well-nourished.  Eyes: Conjunctivae are normal.  Neck: No thyroid mass and no thyromegaly present.  Cardiovascular: Normal rate, regular rhythm, normal heart sounds and normal pulses.   Pulmonary/Chest: Effort normal and breath sounds normal. She has no wheezes. She has no rhonchi. She has no rales. Right breast exhibits no inverted nipple, no mass, no nipple discharge, no skin change and no tenderness. Left breast exhibits no inverted nipple, no mass, no nipple discharge, no skin change and no tenderness. Breasts are symmetrical.  CBE performed.   Genitourinary: Uterus is not enlarged, not fixed and not tender. Cervix  exhibits no motion tenderness, no discharge and no friability. Right adnexum displays no mass, no tenderness and no fullness. Left adnexum displays no mass, no tenderness and no fullness.  Genitourinary Comments: Pap performed. No CMT. Unable to appreciated ovaries.  Lymphadenopathy:       Head (right side): No submental, no submandibular, no tonsillar, no preauricular, no posterior auricular and no occipital adenopathy present.       Head (left side): No submental, no submandibular, no tonsillar, no preauricular, no posterior auricular and no occipital adenopathy present.    She has no cervical adenopathy.       Right  cervical: No superficial cervical, no deep cervical and no posterior cervical adenopathy present.      Left cervical: No superficial cervical, no deep cervical and no posterior cervical adenopathy present.    She has no axillary adenopathy.       Right axillary: No pectoral and no lateral adenopathy present.       Left axillary: No pectoral and no lateral adenopathy present. Neurological: She is alert.  Skin: Skin is warm and dry.  Psychiatric: She has a normal mood and affect. Her speech is normal and behavior is normal. Thought content normal.  Vitals reviewed.      Assessment & Plan:   Problem List Items Addressed This Visit      Other   Routine general medical examination at a health care facility - Primary    CBE and pap performed. No pap in 10 years. We'll decide at next annual physical if she will continue Pap smears as long as this one was normal. Screening labs ordered. CT chest as former smoker. Mammogram ordered and patient understands to schedule.      Relevant Orders   CBC with Differential/Platelet   Comprehensive metabolic panel   Hemoglobin A1c   Lipid panel   TSH   VITAMIN D 25 Hydroxy (Vit-D Deficiency, Fractures)   MM SCREENING BREAST TOMO BILATERAL   CT CHEST LUNG CANCER SCREENING LOW DOSE WO CONTRAST   Hepatitis C antibody   Cytology - PAP       Ms. Daisey does not currently have medications on file.   No orders of the defined types were placed in this encounter.   Return precautions given.   Risks, benefits, and alternatives of the medications and treatment plan prescribed today were discussed, and patient expressed understanding.   Education regarding symptom management and diagnosis given to patient on AVS.   Continue to follow with Allegra GranaArnett, Joice Nazario G, FNP for routine health maintenance.   Jill Arellano and I agreed with plan.   Rennie PlowmanMargaret Jakhi Dishman, FNP

## 2017-05-15 ENCOUNTER — Telehealth: Payer: Self-pay | Admitting: *Deleted

## 2017-05-15 DIAGNOSIS — Z122 Encounter for screening for malignant neoplasm of respiratory organs: Secondary | ICD-10-CM

## 2017-05-15 NOTE — Telephone Encounter (Signed)
Received referral for initial lung cancer screening scan. Contacted patient and obtained smoking history,(former, quit 2008, 88 pack year) as well as answering questions related to screening process. Patient denies signs of lung cancer such as weight loss or hemoptysis. Patient denies comorbidity that would prevent curative treatment if lung cancer were found. Patient is scheduled for shared decision making visit and CT scan on 06/03/17.

## 2017-05-16 LAB — CYTOLOGY - PAP
Diagnosis: NEGATIVE
HPV: NOT DETECTED

## 2017-05-21 ENCOUNTER — Other Ambulatory Visit (INDEPENDENT_AMBULATORY_CARE_PROVIDER_SITE_OTHER): Payer: Medicare PPO

## 2017-05-21 DIAGNOSIS — Z0000 Encounter for general adult medical examination without abnormal findings: Secondary | ICD-10-CM | POA: Diagnosis not present

## 2017-05-21 LAB — CBC WITH DIFFERENTIAL/PLATELET
Basophils Absolute: 0 10*3/uL (ref 0.0–0.1)
Basophils Relative: 0.4 % (ref 0.0–3.0)
EOS ABS: 0.1 10*3/uL (ref 0.0–0.7)
Eosinophils Relative: 2.1 % (ref 0.0–5.0)
HCT: 40.9 % (ref 36.0–46.0)
HEMOGLOBIN: 13.6 g/dL (ref 12.0–15.0)
Lymphocytes Relative: 40.9 % (ref 12.0–46.0)
Lymphs Abs: 1.9 10*3/uL (ref 0.7–4.0)
MCHC: 33.2 g/dL (ref 30.0–36.0)
MCV: 92.6 fl (ref 78.0–100.0)
MONO ABS: 0.5 10*3/uL (ref 0.1–1.0)
Monocytes Relative: 10.1 % (ref 3.0–12.0)
Neutro Abs: 2.1 10*3/uL (ref 1.4–7.7)
Neutrophils Relative %: 46.5 % (ref 43.0–77.0)
Platelets: 239 10*3/uL (ref 150.0–400.0)
RBC: 4.42 Mil/uL (ref 3.87–5.11)
RDW: 12.2 % (ref 11.5–15.5)
WBC: 4.6 10*3/uL (ref 4.0–10.5)

## 2017-05-21 LAB — COMPREHENSIVE METABOLIC PANEL
ALBUMIN: 4 g/dL (ref 3.5–5.2)
ALT: 12 U/L (ref 0–35)
AST: 13 U/L (ref 0–37)
Alkaline Phosphatase: 63 U/L (ref 39–117)
BUN: 10 mg/dL (ref 6–23)
CHLORIDE: 105 meq/L (ref 96–112)
CO2: 28 mEq/L (ref 19–32)
CREATININE: 0.68 mg/dL (ref 0.40–1.20)
Calcium: 8.9 mg/dL (ref 8.4–10.5)
GFR: 91.5 mL/min (ref >60.00)
GLUCOSE: 95 mg/dL (ref 70–99)
Potassium: 4 mEq/L (ref 3.5–5.1)
SODIUM: 138 meq/L (ref 135–145)
Total Bilirubin: 0.4 mg/dL (ref 0.2–1.2)
Total Protein: 6.7 g/dL (ref 6.0–8.3)

## 2017-05-21 LAB — LIPID PANEL
CHOL/HDL RATIO: 2
Cholesterol: 209 mg/dL — ABNORMAL HIGH (ref 0–200)
HDL: 86.8 mg/dL (ref >39.00)
LDL Cholesterol: 91 mg/dL (ref 0–99)
NONHDL: 122.48
Triglycerides: 156 mg/dL — ABNORMAL HIGH (ref 0.0–149.0)
VLDL: 31.2 mg/dL (ref 0.0–40.0)

## 2017-05-21 LAB — HEMOGLOBIN A1C: HEMOGLOBIN A1C: 5.1 % (ref 4.6–6.5)

## 2017-05-21 LAB — TSH: TSH: 2.66 u[IU]/mL (ref 0.35–4.50)

## 2017-05-21 LAB — VITAMIN D 25 HYDROXY (VIT D DEFICIENCY, FRACTURES): VITD: 28.29 ng/mL — ABNORMAL LOW (ref 30.00–100.00)

## 2017-05-22 LAB — HEPATITIS C ANTIBODY: HCV Ab: NONREACTIVE

## 2017-06-03 ENCOUNTER — Inpatient Hospital Stay: Payer: Medicare PPO | Attending: Oncology | Admitting: Oncology

## 2017-06-03 ENCOUNTER — Ambulatory Visit
Admission: RE | Admit: 2017-06-03 | Discharge: 2017-06-03 | Disposition: A | Discharge status: Home / Self Care | Payer: Medicare PPO | Source: Ambulatory Visit | Attending: Family | Admitting: Family

## 2017-06-03 ENCOUNTER — Ambulatory Visit
Admission: RE | Admit: 2017-06-03 | Discharge: 2017-06-03 | Disposition: A | Discharge status: Home / Self Care | Payer: Medicare PPO | Source: Ambulatory Visit | Attending: Oncology | Admitting: Oncology

## 2017-06-03 ENCOUNTER — Encounter: Payer: Self-pay | Admitting: Oncology

## 2017-06-03 DIAGNOSIS — J439 Emphysema, unspecified: Secondary | ICD-10-CM | POA: Diagnosis not present

## 2017-06-03 DIAGNOSIS — Z1231 Encounter for screening mammogram for malignant neoplasm of breast: Secondary | ICD-10-CM | POA: Diagnosis not present

## 2017-06-03 DIAGNOSIS — Z122 Encounter for screening for malignant neoplasm of respiratory organs: Secondary | ICD-10-CM | POA: Diagnosis not present

## 2017-06-03 DIAGNOSIS — I700 Atherosclerosis of aorta: Secondary | ICD-10-CM | POA: Diagnosis not present

## 2017-06-03 DIAGNOSIS — Z87891 Personal history of nicotine dependence: Secondary | ICD-10-CM

## 2017-06-03 DIAGNOSIS — Z0000 Encounter for general adult medical examination without abnormal findings: Secondary | ICD-10-CM

## 2017-06-03 NOTE — Progress Notes (Signed)
In accordance with CMS guidelines, patient has met eligibility criteria including age, absence of signs or symptoms of lung cancer.  Social History  Substance Use Topics  . Smoking status: Former Smoker    Packs/day: 2.00    Years: 44.00    Types: Cigarettes    Quit date: 2008  . Smokeless tobacco: Former Systems developer    Quit date: 08/10/1995  . Alcohol use 0.6 oz/week    1 Standard drinks or equivalent per week     Comment: beer OCC     A shared decision-making session was conducted prior to the performance of CT scan. This includes one or more decision aids, includes benefits and harms of screening, follow-up diagnostic testing, over-diagnosis, false positive rate, and total radiation exposure.  Counseling on the importance of adherence to annual lung cancer LDCT screening, impact of co-morbidities, and ability or willingness to undergo diagnosis and treatment is imperative for compliance of the program.  Counseling on the importance of continued smoking cessation for former smokers; the importance of smoking cessation for current smokers, and information about tobacco cessation interventions have been given to patient including [place] and [facility] quit [place] programs.  Written order for lung cancer screening with LDCT has been given to the patient and any and all questions have been answered to the best of my abilities.   Yearly follow up will be coordinated by Burgess Estelle, Thoracic Navigator.  Faythe Casa, NP 06/03/2017 9:54 AM

## 2017-06-06 ENCOUNTER — Encounter: Payer: Self-pay | Admitting: *Deleted

## 2017-06-17 ENCOUNTER — Encounter: Payer: Self-pay | Admitting: Family

## 2017-06-17 ENCOUNTER — Ambulatory Visit (INDEPENDENT_AMBULATORY_CARE_PROVIDER_SITE_OTHER): Payer: Medicare PPO | Admitting: Family

## 2017-06-17 VITALS — BP 128/64 | HR 73 | Temp 98.4°F | Ht 60.0 in | Wt 157.0 lb

## 2017-06-17 DIAGNOSIS — I2510 Atherosclerotic heart disease of native coronary artery without angina pectoris: Secondary | ICD-10-CM | POA: Insufficient documentation

## 2017-06-17 NOTE — Assessment & Plan Note (Addendum)
Discussed results of CT and family history CVD. She would like to postpone starting cholesterol  Medication until evaluated by cardiology ( likely stress test). Discussed recommendations of calcium supplementations and implications of CAD. Will follow.

## 2017-06-17 NOTE — Progress Notes (Signed)
Pre visit review using our clinic review tool, if applicable. No additional management support is needed unless otherwise documented below in the visit note. 

## 2017-06-17 NOTE — Patient Instructions (Addendum)
  Discontinue high dose calcium as discussed  Diet low in trans and saturated fats.   Cardiology referral.   Information as guide below:   For post menopausal women, guidelines recommend a diet with 1200 mg of Calcium per day. If you are eating calcium rich foods, you do not need a calcium supplement. The body better absorbs the calcium that you eat over supplementation. If you do supplement, I recommend not supplementing the full 1200 mg/ day as this can lead to increased risk of cardiovascular disease. I recommend Calcium Citrate over the counter, and you may take a total of 600 to 800 mg per day in divided doses with meals for best absorption.   For bone health, you need adequate vitamin D, and I recommend you supplement as it is harder to do so with diet alone. I recommend cholecalciferol 800 units daily.  Also, please ensure you are following a diet high in calcium -- research shows better outcomes with dietary sources including kale, yogurt, broccolii, cheese, okra, almonds- to name a few.     Also remember that exercise is a great medicine for maintain and preserve bone health. Advise moderate exercise for 30 minutes , 3 times per week.    [id number]

## 2017-06-17 NOTE — Progress Notes (Addendum)
Subjective:    Patient ID: Jill Arellano, female    DOB: [birth year], 66 y.o.   MRN: [id number]  CC: Jill Arellano is a 66 y.o. female who presents today for follow up.   HPI: CAD- seen on CT chest. Would like to discuss options today.  Walks 7 miles most days at work, no CP.    Denies exertional chest pain or pressure, numbness or tingling radiating to left arm or jaw, palpitations, dizziness, frequent headaches, changes in vision, or shortness of breath.   Takes fish oil tablets.   Had been taking calcium 800mg  TID - stopped 3 months ago. Was on the medication for 15 years.   H/o osteopenia.       HISTORY:  Past Medical History:  Diagnosis Date  . Chicken pox    History reviewed. No pertinent surgical history. Family History  Problem Relation Age of Onset  . Heart disease Mother        CHF  . Cancer Sister        lung  . Heart disease Sister   . Cancer Brother        colon    Allergies: Patient has no known allergies. No current outpatient prescriptions on file prior to visit.   No current facility-administered medications on file prior to visit.     Social History  Substance Use Topics  . Smoking status: Former Smoker    Packs/day: 2.00    Years: 44.00    Types: Cigarettes    Quit date: 2008  . Smokeless tobacco: Former Neurosurgeon    Quit date: 08/10/1995  . Alcohol use 0.6 oz/week    1 Standard drinks or equivalent per week     Comment: beer OCC    Review of Systems  Constitutional: Negative for chills and fever.  Respiratory: Negative for cough.   Cardiovascular: Negative for chest pain and palpitations.  Gastrointestinal: Negative for nausea and vomiting.      Objective:    BP 128/64   Pulse 73   Temp 98.4 F (36.9 C) (Oral)   Ht 5' (1.524 m)   Wt 157 lb (71.2 kg)   SpO2 96%   BMI 30.66 kg/m  BP Readings from Last 3 Encounters:  06/17/17 128/64  05/13/17 120/66  12/27/16 138/80   Wt Readings from Last 3 Encounters:  06/17/17 157 lb  (71.2 kg)  06/03/17 155 lb (70.3 kg)  05/13/17 156 lb 9.6 oz (71 kg)    Physical Exam  Constitutional: She appears well-developed and well-nourished.  Eyes: Conjunctivae are normal.  Cardiovascular: Normal rate, regular rhythm, normal heart sounds and normal pulses.   Pulmonary/Chest: Effort normal and breath sounds normal. She has no wheezes. She has no rhonchi. She has no rales.  Neurological: She is alert.  Skin: Skin is warm and dry.  Psychiatric: She has a normal mood and affect. Her speech is normal and behavior is normal. Thought content normal.  Vitals reviewed.      Assessment & Plan:   Problem List Items Addressed This Visit      Cardiovascular and Mediastinum   Coronary artery disease involving native heart without angina pectoris - Primary    Discussed results of CT and family history CVD. She would like to postpone starting cholesterol  Medication until evaluated by cardiology ( likely stress test). Discussed recommendations of calcium supplementations and implications of CAD. Will follow.       Relevant Orders   Ambulatory referral to Cardiology  [name] does not currently have medications on file.   No orders of the defined types were placed in this encounter.   Return precautions given.   Risks, benefits, and alternatives of the medications and treatment plan prescribed today were discussed, and patient expressed understanding.   Education regarding symptom management and diagnosis given to patient on AVS.  Continue to follow with Allegra GranaArnett, Mildred Bollard G, FNP for routine health maintenance.   Caryl NeverBonnie Terlizzi and I agreed with plan.   Rennie PlowmanMargaret Jhace Fennell, FNP

## 2017-08-05 ENCOUNTER — Encounter: Payer: Self-pay | Admitting: Cardiovascular Disease

## 2017-08-05 ENCOUNTER — Ambulatory Visit (INDEPENDENT_AMBULATORY_CARE_PROVIDER_SITE_OTHER): Payer: Medicare PPO | Admitting: Cardiovascular Disease

## 2017-08-05 VITALS — BP 140/66 | HR 65 | Ht 60.0 in | Wt 155.5 lb

## 2017-08-05 DIAGNOSIS — I2510 Atherosclerotic heart disease of native coronary artery without angina pectoris: Secondary | ICD-10-CM | POA: Diagnosis not present

## 2017-08-05 DIAGNOSIS — R0602 Shortness of breath: Secondary | ICD-10-CM | POA: Diagnosis not present

## 2017-08-05 DIAGNOSIS — I2584 Coronary atherosclerosis due to calcified coronary lesion: Secondary | ICD-10-CM | POA: Diagnosis not present

## 2017-08-05 MED ORDER — ASPIRIN EC 81 MG PO TBEC: 81.0000 mg | DELAYED_RELEASE_TABLET | Freq: Every day | ORAL | 3 refills | Status: DC

## 2017-08-05 NOTE — Patient Instructions (Addendum)
Medication Instructions:  Your physician has recommended you make the following change in your medication:  START taking aspirin 81mg  once daily  Labwork: none  Testing/Procedures: Your physician has requested that you have an exercise tolerance test. For further information please visit [email or web address]. Please also follow instruction sheet, as given.  No caffeine or smoking 24 hours before your test. This includes tea, coffee, soda, chocolate and decaffeinated beverages. Please wear comfortable walking shoes (ie., sneakers). No flip flops or sandals.     Follow-Up: Your physician recommends that you schedule a follow-up appointment as needed.    Any Other Special Instructions Will Be Listed Below (If Applicable).     If you need a refill on your cardiac medications before your next appointment, please call your pharmacy.   Exercise Stress Electrocardiogram An exercise stress electrocardiogram is a test to check how blood flows to your heart. It is done to find areas of poor blood flow. You will need to walk on a treadmill for this test. The electrocardiogram will record your heartbeat when you are at rest and when you are exercising. What happens before the procedure?  Do not have drinks with caffeine or foods with caffeine for 24 hours before the test, or as told by your doctor. This includes coffee, tea (even decaf tea), sodas, chocolate, and cocoa.  Follow your doctor's instructions about eating and drinking before the test.  Ask your doctor what medicines you should or should not take before the test. Take your medicines with water unless told by your doctor not to.  If you use an inhaler, bring it with you to the test.  Bring a snack to eat after the test.  Do not  smoke for 4 hours before the test.  Do not put lotions, powders, creams, or oils on your chest before the test.  Wear comfortable shoes and clothing. What happens during the procedure?  You will  have patches put on your chest. Small areas of your chest may need to be shaved. Wires will be connected to the patches.  Your heart rate will be watched while you are resting and while you are exercising.  You will walk on the treadmill. The treadmill will slowly get faster to raise your heart rate.  The test will take about 1-2 hours. What happens after the procedure?  Your heart rate and blood pressure will be watched after the test.  You may return to your normal diet, activities, and medicines or as told by your doctor. This information is not intended to replace advice given to you by your health care provider. Make sure you discuss any questions you have with your health care provider. Document Released: 03/18/2008 Document Revised: 05/29/2016 Document Reviewed: 06/07/2013 Elsevier Interactive Patient Education  [employer]2018 Elsevier Inc.

## 2017-08-05 NOTE — Progress Notes (Signed)
Cardiology Office Note   Date:  08/05/2017   ID:  Jill Arellano, DOB 05/26/1951, MRN [id number]  PCP:  Allegra GranaArnett, Margaret G, FNP  Cardiologist:   Lorine BearsMuhammad Ulrick Methot, MD   Chief Complaint  Patient presents with  . other    Ref by Dr. Jason CoopArnett for an abnormal chest CT scan. Meds reviewed by the pt. verbally. Pt. c/o shortness of breath.       History of Present Illness: Jill NeverBonnie Stencil is a 66 y.o. female who was referred by Rennie PlowmanMargaret Arnett for evaluation of coronary atherosclerosis noted in CT scan of the lungs. He had CT scan of the lungs in August for lung cancer screening due to prior history of tobacco use.  The scan showed coronary and aortic calcifications. She has no history of diabetes, hypertension or hyperlipidemia.  If anything, her most recent lipid profile showed high HDL and an LDL of 91. She denies any chest pain.  She reports mild exertional dyspnea with no orthopnea, PND or palpitations.  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 She does have known family history of coronary artery disease affecting multiple siblings.  All of them are active smokers. The patient quit smoking in 1996.  Past Medical History:  Diagnosis Date  . Chicken pox   . Vitamin D deficiency     Past Surgical History:  Procedure Laterality Date  . TUBAL LIGATION       Current Outpatient Prescriptions  Medication Sig Dispense Refill  . VITAMIN D, CHOLECALCIFEROL, PO Take 800 Units by mouth daily.     No current facility-administered medications for this visit.     Allergies:   Patient has no known allergies.    Social History:  The patient  reports that she quit smoking about 10 years ago. Her smoking use included Cigarettes. She has a 88.00 pack-year smoking history. She quit smokeless tobacco use about 22 years ago. She reports that she drinks about 0.6 oz of alcohol per week . She reports that she does not use drugs.   Family History:  The patient's family history  includes Cancer in her brother and sister; Heart disease in her mother and sister; Heart failure in her brother and mother.    ROS:  Please see the history of present illness.   Otherwise, review of systems are positive for none.   All other systems are reviewed and negative.    PHYSICAL EXAM: VS:  BP 140/66 (BP Location: Right Arm, Patient Position: Sitting, Cuff Size: Normal)   Pulse 65   Ht 5' (1.524 m)   Wt 155 lb 8 oz (70.5 kg)   BMI 30.37 kg/m  , BMI Body mass index is 30.37 kg/m. GEN: Well nourished, well developed, in no acute distress  HEENT: normal  Neck: no JVD, carotid bruits, or masses Cardiac: RRR; no murmurs, rubs, or gallops,no edema  Respiratory:  clear to auscultation bilaterally, normal work of breathing GI: soft, nontender, nondistended, + BS MS: no deformity or atrophy  Skin: warm and dry, no rash Neuro:  Strength and sensation are intact Psych: euthymic mood, full affect   EKG:  EKG is ordered today. The ekg ordered today demonstrates normal sinus rhythm with no significant ST or T wave changes.   Recent Labs: 05/21/2017: ALT 12; BUN 10; Creatinine, Ser 0.68; Hemoglobin 13.6; Platelets 239.0; Potassium 4.0; Sodium 138; TSH 2.66    Lipid Panel    Component Value Date/Time   CHOL 209 (H)  05/21/2017 0816   TRIG 156.0 (H) 05/21/2017 0816   HDL 86.80 05/21/2017 0816   CHOLHDL 2 05/21/2017 0816   VLDL 31.2 05/21/2017 0816   LDLCALC 91 05/21/2017 0816      Wt Readings from Last 3 Encounters:  08/05/17 155 lb 8 oz (70.5 kg)  06/17/17 157 lb (71.2 kg)  06/03/17 155 lb (70.3 kg)      Other studies Reviewed: Additional studies/ records that were reviewed today include: CT scan of the lungs previous labs. Review of the above records demonstrates: Outlined above  PAD Screen 08/05/2017  Previous PAD dx? No  Previous surgical procedure? No  Pain with walking? No  Feet/toe relief with dangling? No  Painful, non-healing ulcers? No  Extremities  discolored? No      ASSESSMENT AND PLAN:  1.  Coronary atherosclerosis: This was noted incidentally on recent CT scan of the lungs.  I discussed with her the natural history of atherosclerosis.  Her symptoms include mild exertional dyspnea without chest pain.  Thus, I am going to obtain a treadmill stress test that showed no evidence of obstructive coronary artery disease. I advised her to start taking aspirin 81 mg once daily and discussed with her the importance of healthy lifestyle changes.  I reviewed most recent lipid profile showed an HDL of 86 and an LDL of 91.  Triglyceride was borderline at 156.  Based on this, it is hard to argue for treatment with a statin.    Disposition:   FU with me as needed.   Signed,  Lorine Bears, MD  08/05/2017 3:01 PM    [facility]

## 2017-08-07 ENCOUNTER — Telehealth: Payer: Self-pay | Admitting: *Deleted

## 2017-08-07 NOTE — Telephone Encounter (Signed)
Patient verbalized understanding of appointment date and time for tomorrow and reviewed preprocedural instructions.

## 2017-08-08 ENCOUNTER — Ambulatory Visit (INDEPENDENT_AMBULATORY_CARE_PROVIDER_SITE_OTHER): Payer: Medicare PPO

## 2017-08-08 DIAGNOSIS — R0602 Shortness of breath: Secondary | ICD-10-CM

## 2017-08-12 LAB — EXERCISE TOLERANCE TEST
CSEPEDS: 22 s
CSEPPHR: 157 {beats}/min
Estimated workload: 7 METS
Exercise duration (min): 5 min
MPHR: 152 {beats}/min
Percent HR: 103 %
Rest HR: 77 {beats}/min

## 2017-08-15 ENCOUNTER — Encounter: Payer: Self-pay | Admitting: Cardiovascular Disease

## 2017-08-15 ENCOUNTER — Ambulatory Visit (INDEPENDENT_AMBULATORY_CARE_PROVIDER_SITE_OTHER): Payer: Medicare PPO | Admitting: Cardiovascular Disease

## 2017-08-15 ENCOUNTER — Other Ambulatory Visit
Admission: RE | Admit: 2017-08-15 | Discharge: 2017-08-15 | Disposition: A | Discharge status: Home / Self Care | Payer: Medicare PPO | Source: Ambulatory Visit | Attending: Cardiovascular Disease | Admitting: Cardiovascular Disease

## 2017-08-15 VITALS — BP 132/78 | HR 70 | Ht 61.0 in | Wt 155.8 lb

## 2017-08-15 DIAGNOSIS — Z01812 Encounter for preprocedural laboratory examination: Secondary | ICD-10-CM

## 2017-08-15 DIAGNOSIS — R9439 Abnormal result of other cardiovascular function study: Secondary | ICD-10-CM | POA: Diagnosis not present

## 2017-08-15 DIAGNOSIS — I25118 Atherosclerotic heart disease of native coronary artery with other forms of angina pectoris: Secondary | ICD-10-CM

## 2017-08-15 LAB — BASIC METABOLIC PANEL
ANION GAP: 7 (ref 5–15)
BUN: 16 mg/dL (ref 6–20)
CHLORIDE: 106 mmol/L (ref 101–111)
CO2: 25 mmol/L (ref 22–32)
Calcium: 9.4 mg/dL (ref 8.9–10.3)
Creatinine, Ser: 0.51 mg/dL (ref 0.44–1.00)
GFR calc non Af Amer: >60 mL/min (ref >60)
Glucose, Bld: 90 mg/dL (ref 65–99)
Potassium: 4.1 mmol/L (ref 3.5–5.1)
Sodium: 138 mmol/L (ref 135–145)

## 2017-08-15 LAB — CBC
HEMATOCRIT: 41.4 % (ref 35.0–47.0)
HEMOGLOBIN: 14.1 g/dL (ref 12.0–16.0)
MCH: 30.3 pg (ref 26.0–34.0)
MCHC: 33.9 g/dL (ref 32.0–36.0)
MCV: 89.2 fL (ref 80.0–100.0)
Platelets: 244 10*3/uL (ref 150–440)
RBC: 4.64 MIL/uL (ref 3.80–5.20)
RDW: 12.1 % (ref 11.5–14.5)
WBC: 6.7 10*3/uL (ref 3.6–11.0)

## 2017-08-15 LAB — PROTIME-INR
INR: 0.97
Prothrombin Time: 12.8 seconds (ref 11.4–15.2)

## 2017-08-15 NOTE — H&P (View-Only) (Signed)
Cardiology Office Note   Date:  08/15/2017   ID:  Jill NeverBonnie Landfair, DOB 07/27/1951, MRN [id number]  PCP:  Allegra GranaArnett, Margaret G, FNP  Cardiologist:   Lorine BearsMuhammad Arida, MD   Chief Complaint  Patient presents with  . other    Follow up from ETT. Meds reviewed by the pt. verbally. "doing well."       History of Present Illness: Jill Arellano is a 66 y.o. female who is here today for a follow-up visit regarding coronary artery disease. She was seen recently for evaluation of coronary atherosclerosis noted in CT scan of the lungs. He had CT scan of the lungs in August for lung cancer screening due to prior history of tobacco use.  The scan showed coronary and aortic calcifications. She has no history of diabetes, hypertension or hyperlipidemia.  If anything, her most recent lipid profile showed high HDL and an LDL of 91. She does have known family history of coronary artery disease affecting multiple siblings.  All of them are active smokers. The patient quit smoking in 1996.  Her symptoms included moderate exertional dyspnea without chest pain.  I proceeded with a treadmill stress test which was suggestive of ischemia.  She exercised for 5 minutes and 22 seconds with hypertensive response to exercise.  He had 1 mm of horizontal ST depression in the inferior leads as well as V5 and V6.  She is here to discuss results.  Past Medical History:  Diagnosis Date  . Chicken pox   . Vitamin D deficiency     Past Surgical History:  Procedure Laterality Date  . TUBAL LIGATION       Current Outpatient Prescriptions  Medication Sig Dispense Refill  . aspirin EC 81 MG tablet Take 1 tablet (81 mg total) by mouth daily. 90 tablet 3  . VITAMIN D, CHOLECALCIFEROL, PO Take 800 Units by mouth daily.     No current facility-administered medications for this visit.     Allergies:   Patient has no known allergies.    Social History:  The patient  reports that she quit smoking about 10 years  ago. Her smoking use included Cigarettes. She has a 88.00 pack-year smoking history. She quit smokeless tobacco use about 22 years ago. She reports that she drinks about 0.6 oz of alcohol per week . She reports that she does not use drugs.   Family History:  The patient's family history includes Cancer in her brother and sister; Heart disease in her mother and sister; Heart failure in her brother and mother.    ROS:  Please see the history of present illness.   Otherwise, review of systems are positive for none.   All other systems are reviewed and negative.    PHYSICAL EXAM: VS:  BP 132/78 (BP Location: Left Arm, Patient Position: Sitting, Cuff Size: Normal)   Pulse 70   Ht 5\' 1"  (1.549 m)   Wt 155 lb 12 oz (70.6 kg)   BMI 29.43 kg/m  , BMI Body mass index is 29.43 kg/m. GEN: Well nourished, well developed, in no acute distress  HEENT: normal  Neck: no JVD, carotid bruits, or masses Cardiac: RRR; no murmurs, rubs, or gallops,no edema  Respiratory:  clear to auscultation bilaterally, normal work of breathing GI: soft, nontender, nondistended, + BS MS: no deformity or atrophy  Skin: warm and dry, no rash Neuro:  Strength and sensation are intact Psych: euthymic mood, full affect   EKG:  EKG is not  ordered  today.   Recent Labs: 05/21/2017: ALT 12; TSH 2.66 08/15/2017: BUN 16; Creatinine, Ser 0.51; Hemoglobin 14.1; Platelets 244; Potassium 4.1; Sodium 138    Lipid Panel    Component Value Date/Time   CHOL 209 (H) 05/21/2017 0816   TRIG 156.0 (H) 05/21/2017 0816   HDL 86.80 05/21/2017 0816   CHOLHDL 2 05/21/2017 0816   VLDL 31.2 05/21/2017 0816   LDLCALC 91 05/21/2017 0816      Wt Readings from Last 3 Encounters:  08/15/17 155 lb 12 oz (70.6 kg)  08/05/17 155 lb 8 oz (70.5 kg)  06/17/17 157 lb (71.2 kg)      Other studies Reviewed: Additional studies/ records that were reviewed today include: CT scan of the lungs previous labs. Review of the above records  demonstrates: Outlined above  PAD Screen 08/05/2017  Previous PAD dx? No  Previous surgical procedure? No  Pain with walking? No  Feet/toe relief with dangling? No  Painful, non-healing ulcers? No  Extremities discolored? No      ASSESSMENT AND PLAN:  1.   Coronary artery disease involving native coronary arteries with other forms of angina: Exertional dyspnea is likely angina equivalent.  CT scan of the lungs showed evidence of coronary atherosclerosis.  Treadmill stress test was positive for ischemia with intermediate risk Duke treadmill score.  I discussed different management options with the patient and we definitely need other testing to confirm the degree of coronary artery disease.  I think there are 2 reasonable options.  Either CTA of the coronary arteries with FFR or proceeding directly with cardiac catheterization given that there is high likelihood of abnormal CTA based on abnormal treadmill stress test.  I discussed the 2 test in details.  I discussed cardiac catheterization as well as risks and benefits. After discussion, we elected to schedule left heart catheterization possible PCI. Continue low-dose aspirin daily. Further recommendations to follow after cardiac catheterization.  I reviewed most recent lipid profile showed an HDL of 86 and an LDL of 91.  Triglyceride was borderline at 156.  Treatment with a statin might be indicated regardless if angiography shows significant coronary artery disease.  Disposition:   FU with me in 1 month.  Signed,  Lorine Bears, MD  08/15/2017 4:02 PM    [facility]

## 2017-08-15 NOTE — Progress Notes (Signed)
Cardiology Office Note   Date:  08/15/2017   ID:  Jill Arellano, DOB 07/27/1951, MRN [id number]  PCP:  Allegra GranaArnett, Margaret G, FNP  Cardiologist:   Lorine BearsMuhammad Arida, MD   Chief Complaint  Patient presents with  . other    Follow up from ETT. Meds reviewed by the pt. verbally. "doing well."       History of Present Illness: Jill NeverBonnie Decandia is a 66 y.o. female who is here today for a follow-up visit regarding coronary artery disease. She was seen recently for evaluation of coronary atherosclerosis noted in CT scan of the lungs. He had CT scan of the lungs in August for lung cancer screening due to prior history of tobacco use.  The scan showed coronary and aortic calcifications. She has no history of diabetes, hypertension or hyperlipidemia.  If anything, her most recent lipid profile showed high HDL and an LDL of 91. She does have known family history of coronary artery disease affecting multiple siblings.  All of them are active smokers. The patient quit smoking in 1996.  Her symptoms included moderate exertional dyspnea without chest pain.  I proceeded with a treadmill stress test which was suggestive of ischemia.  She exercised for 5 minutes and 22 seconds with hypertensive response to exercise.  He had 1 mm of horizontal ST depression in the inferior leads as well as V5 and V6.  She is here to discuss results.  Past Medical History:  Diagnosis Date  . Chicken pox   . Vitamin D deficiency     Past Surgical History:  Procedure Laterality Date  . TUBAL LIGATION       Current Outpatient Prescriptions  Medication Sig Dispense Refill  . aspirin EC 81 MG tablet Take 1 tablet (81 mg total) by mouth daily. 90 tablet 3  . VITAMIN D, CHOLECALCIFEROL, PO Take 800 Units by mouth daily.     No current facility-administered medications for this visit.     Allergies:   Patient has no known allergies.    Social History:  The patient  reports that she quit smoking about 10 years  ago. Her smoking use included Cigarettes. She has a 88.00 pack-year smoking history. She quit smokeless tobacco use about 22 years ago. She reports that she drinks about 0.6 oz of alcohol per week . She reports that she does not use drugs.   Family History:  The patient's family history includes Cancer in her brother and sister; Heart disease in her mother and sister; Heart failure in her brother and mother.    ROS:  Please see the history of present illness.   Otherwise, review of systems are positive for none.   All other systems are reviewed and negative.    PHYSICAL EXAM: VS:  BP 132/78 (BP Location: Left Arm, Patient Position: Sitting, Cuff Size: Normal)   Pulse 70   Ht 5\' 1"  (1.549 m)   Wt 155 lb 12 oz (70.6 kg)   BMI 29.43 kg/m  , BMI Body mass index is 29.43 kg/m. GEN: Well nourished, well developed, in no acute distress  HEENT: normal  Neck: no JVD, carotid bruits, or masses Cardiac: RRR; no murmurs, rubs, or gallops,no edema  Respiratory:  clear to auscultation bilaterally, normal work of breathing GI: soft, nontender, nondistended, + BS MS: no deformity or atrophy  Skin: warm and dry, no rash Neuro:  Strength and sensation are intact Psych: euthymic mood, full affect   EKG:  EKG is not  ordered  today.   Recent Labs: 05/21/2017: ALT 12; TSH 2.66 08/15/2017: BUN 16; Creatinine, Ser 0.51; Hemoglobin 14.1; Platelets 244; Potassium 4.1; Sodium 138    Lipid Panel    Component Value Date/Time   CHOL 209 (H) 05/21/2017 0816   TRIG 156.0 (H) 05/21/2017 0816   HDL 86.80 05/21/2017 0816   CHOLHDL 2 05/21/2017 0816   VLDL 31.2 05/21/2017 0816   LDLCALC 91 05/21/2017 0816      Wt Readings from Last 3 Encounters:  08/15/17 155 lb 12 oz (70.6 kg)  08/05/17 155 lb 8 oz (70.5 kg)  06/17/17 157 lb (71.2 kg)      Other studies Reviewed: Additional studies/ records that were reviewed today include: CT scan of the lungs previous labs. Review of the above records  demonstrates: Outlined above  PAD Screen 08/05/2017  Previous PAD dx? No  Previous surgical procedure? No  Pain with walking? No  Feet/toe relief with dangling? No  Painful, non-healing ulcers? No  Extremities discolored? No      ASSESSMENT AND PLAN:  1.   Coronary artery disease involving native coronary arteries with other forms of angina: Exertional dyspnea is likely angina equivalent.  CT scan of the lungs showed evidence of coronary atherosclerosis.  Treadmill stress test was positive for ischemia with intermediate risk Duke treadmill score.  I discussed different management options with the patient and we definitely need other testing to confirm the degree of coronary artery disease.  I think there are 2 reasonable options.  Either CTA of the coronary arteries with FFR or proceeding directly with cardiac catheterization given that there is high likelihood of abnormal CTA based on abnormal treadmill stress test.  I discussed the 2 test in details.  I discussed cardiac catheterization as well as risks and benefits. After discussion, we elected to schedule left heart catheterization possible PCI. Continue low-dose aspirin daily. Further recommendations to follow after cardiac catheterization.  I reviewed most recent lipid profile showed an HDL of 86 and an LDL of 91.  Triglyceride was borderline at 156.  Treatment with a statin might be indicated regardless if angiography shows significant coronary artery disease.  Disposition:   FU with me in 1 month.  Signed,  Lorine Bears, MD  08/15/2017 4:02 PM    [facility]

## 2017-08-15 NOTE — Patient Instructions (Addendum)
Medication Instructions:  Your physician recommends that you continue on your current medications as directed. Please refer to the Current Medication list given to you today.   Labwork: BMET, CBC, PT/INR at the [facility] lab.  Testing/Procedures: Your physician has requested that you have a cardiac catheterization. Cardiac catheterization is used to diagnose and/or treat various heart conditions. Doctors may recommend this procedure for a number of different reasons. The most common reason is to evaluate chest pain. Chest pain can be a symptom of coronary artery disease (CAD), and cardiac catheterization can show whether plaque is narrowing or blocking your heart's arteries. This procedure is also used to evaluate the valves, as well as measure the blood flow and oxygen levels in different parts of your heart. For further information please visit [email or web address] Please follow instruction sheet, as given.    [facility] CARDIOVASCULAR DIVISION [facility] [address], [address] [place] [address] Dept: [phone] Loc: [phone]  Fayrene Towner  08/15/2017  You are scheduled for a Cardiac Catheterization on Thursday, November 15 with Dr. Lorine Bears.  1. Please arrive at the [facility] at [employer] parking service is available.   Special note: Every effort is made to have your procedure done on time. Please understand that emergencies sometimes delay scheduled procedures.  2. Diet: Do not eat or drink anything after midnight prior to your procedure except sips of water to take medications.  3. Labs: CBC, BMET, PT/INR at the [facility] lab  4. Medication instructions in preparation for your procedure:  You may take your morning medications with a sip of water.  On the morning of your procedure, take your Aspirin and any morning medicines NOT listed above.  You may use sips of water.  5. Plan  for one night stay--bring personal belongings. 6. Bring a current list of your medications and current insurance cards. 7. You MUST have a responsible person to drive you home. 8. Someone MUST be with you the first 24 hours after you arrive home or your discharge will be delayed. 9. Please wear clothes that are easy to get on and off and wear slip-on shoes.  Thank you for allowing [place] to care for you!   -- [facility]  Follow-Up: Your physician recommends that you schedule a follow-up appointment in: one month with Dr. Kirke Corin.    Any Other Special Instructions Will Be Listed Below (If Applicable).     If you need a refill on your cardiac medications before your next appointment, please call your pharmacy.   Angiogram An angiogram is an X-ray test. It is used to look at your blood vessels. For this test, a dye is put into the blood vessel being checked. The dye shows up on X-rays. It helps your doctor see if there is a blockage or other problem in the blood vessel. What happens before the procedure?  Follow your doctor's instructions about limiting what you eat or drink.  Ask your doctor if you may drink enough water to take any needed medicines the morning of the test.  Plan to have someone take you home after the test.  If you go home the same day as the test, plan to have someone stay with you for 24 hours. What happens during the procedure?  An IV tube will be put into one of your veins.  You will be given a medicine that makes you relax (sedative).  Your skin will be washed where the thin tube (catheter) will be put in. Hair may be removed from this area. The tube may be put into: ? Your upper leg area (groin). ? The fold of your arm, near your elbow. ? Your wrist.  You will be given a medicine that numbs the area where the tube will be inserted (local anesthetic).  The tube will be inserted into a blood vessel.  Using a type of X-ray  (fluoroscopy) to see, your doctor will move the tube into the blood vessel to check it.  Dye will be put in through the tube. X-rays of your blood vessels will then be taken. Different doctors and hospitals may do this procedure differently. What happens after the procedure?  If the test is done through the leg, you will be kept in bed lying flat for several hours. You will be told to not bend or cross your legs.  The area where the tube was inserted will be checked often.  The pulse in your feet or wrist will be checked often.  More tests or X-rays may be done. This information is not intended to replace advice given to you by your health care provider. Make sure you discuss any questions you have with your health care provider. Document Released: 12/27/2008 Document Revised: 03/07/2016 Document Reviewed: 03/03/2013 Elsevier Interactive Patient Education  2017 Elsevier Inc.  Angiogram, Care After This sheet gives you information about how to care for yourself after your procedure. Your doctor may also give you more specific instructions. If you have problems or questions, contact your doctor. Follow these instructions at home: Insertion site care  Follow instructions from your doctor about how to take care of your long, thin tube (catheter) insertion area. Make sure you: ? Wash your hands with soap and water before you change your bandage (dressing). If you cannot use soap and water, use hand sanitizer. ? Change your bandage as told by your doctor. ? Leave stitches (sutures), skin glue, or skin tape (adhesive) strips in place. They may need to stay in place for 2 weeks or longer. If tape strips get loose and curl up, you may trim the loose edges. Do not remove tape strips completely unless your doctor says it is okay.  Do not take baths, swim, or use a hot tub until your doctor says it is okay.  You may shower 24-48 hours after the procedure or as told by your doctor. ? Gently wash  the area with plain soap and water. ? Pat the area dry with a clean towel. ? Do not rub the area. This may cause bleeding.  Do not apply powder or lotion to the area. Keep the area clean and dry.  Check your insertion area every day for signs of infection. Check for: ? More redness, swelling, or pain. ? Fluid or blood. ? Warmth. ? Pus or a bad smell. Activity  Rest as told by your doctor, usually for 1-2 days.  Do not lift anything that is heavier than 10 lbs. (4.5 kg) or as told by your doctor.  Do not drive for 24 hours if you were given a medicine to help you relax (sedative).  Do not drive or use heavy machinery while taking prescription pain medicine. General instructions  Go back to your normal activities as told by your doctor, usually in about a week. Ask your doctor what activities are safe for you.  If the insertion area starts to bleed, lie flat and  put pressure on the area. If the bleeding does not stop, get help right away. This is an emergency.  Drink enough fluid to keep your pee (urine) clear or pale yellow.  Take over-the-counter and prescription medicines only as told by your doctor.  Keep all follow-up visits as told by your doctor. This is important. Contact a doctor if:  You have a fever.  You have chills.  You have more redness, swelling, or pain around your insertion area.  You have fluid or blood coming from your insertion area.  The insertion area feels warm to the touch.  You have pus or a bad smell coming from your insertion area.  You have more bruising around the insertion area.  Blood collects in the tissue around the insertion area (hematoma) that may be painful to the touch. Get help right away if:  You have a lot of pain in the insertion area.  The insertion area swells very fast.  The insertion area is bleeding, and the bleeding does not stop after holding steady pressure on the area.  The area near or just beyond the insertion  area becomes pale, cool, tingly, or numb. These symptoms may be an emergency. Do not wait to see if the symptoms will go away. Get medical help right away. Call your local emergency services (911 in the [place] Do not drive yourself to the hospital. Summary  After the procedure, it is common to have bruising and tenderness at the long, thin tube insertion area.  After the procedure, it is important to rest and drink plenty of fluids.  Do not take baths, swim, or use a hot tub until your doctor says it is okay to do so. You may shower 24-48 hours after the procedure or as told by your doctor.  If the insertion area starts to bleed, lie flat and put pressure on the area. If the bleeding does not stop, get help right away. This is an emergency. This information is not intended to replace advice given to you by your health care provider. Make sure you discuss any questions you have with your health care provider. Document Released: 12/27/2008 Document Revised: 09/24/2016 Document Reviewed: 09/24/2016 Elsevier Interactive Patient Education  2017 [employer]

## 2017-08-27 ENCOUNTER — Telehealth: Payer: Self-pay | Admitting: Cardiovascular Disease

## 2017-08-27 NOTE — Telephone Encounter (Signed)
S/w pt. Explained that Dr. Kirke CorinArida is unable to do her cath tomorrow d/t emergency.  Pt's children are driving in from [place] and [place] today to be with her after procedure. [facility] is agree[facility]. S/w [facility]ney, [facility] lab, who scheduled cardiac catheterization tomorrow with Dr. Allyson SabalBerry, 11am arrival. Pt agreeable.  [facility] at [facility]

## 2017-08-27 NOTE — Telephone Encounter (Signed)
Per MD, pt's cath needs to be rescheduled from November 15 to next week. Left message on machine for patient to contact the office.

## 2017-08-27 NOTE — Telephone Encounter (Signed)
Attempted to s/w pt again regarding need to reschedule cath to 11/19

## 2017-08-28 ENCOUNTER — Ambulatory Visit (HOSPITAL_COMMUNITY)
Admission: RE | Admit: 2017-08-28 | Discharge: 2017-08-28 | Disposition: A | Discharge status: Home / Self Care | Payer: Medicare PPO | Source: Ambulatory Visit | Attending: Cardiovascular Disease | Admitting: Cardiovascular Disease

## 2017-08-28 ENCOUNTER — Encounter (HOSPITAL_COMMUNITY)
Admission: RE | Disposition: A | Discharge status: Home / Self Care | Payer: Self-pay | Source: Ambulatory Visit | Attending: Cardiovascular Disease

## 2017-08-28 DIAGNOSIS — Z8249 Family history of ischemic heart disease and other diseases of the circulatory system: Secondary | ICD-10-CM | POA: Insufficient documentation

## 2017-08-28 DIAGNOSIS — Z7982 Long term (current) use of aspirin: Secondary | ICD-10-CM | POA: Diagnosis not present

## 2017-08-28 DIAGNOSIS — Z789 Other specified health status: Secondary | ICD-10-CM | POA: Diagnosis not present

## 2017-08-28 DIAGNOSIS — R9439 Abnormal result of other cardiovascular function study: Secondary | ICD-10-CM | POA: Insufficient documentation

## 2017-08-28 DIAGNOSIS — E559 Vitamin D deficiency, unspecified: Secondary | ICD-10-CM | POA: Insufficient documentation

## 2017-08-28 DIAGNOSIS — I2510 Atherosclerotic heart disease of native coronary artery without angina pectoris: Secondary | ICD-10-CM | POA: Diagnosis present

## 2017-08-28 DIAGNOSIS — Z87891 Personal history of nicotine dependence: Secondary | ICD-10-CM | POA: Diagnosis not present

## 2017-08-28 DIAGNOSIS — I25118 Atherosclerotic heart disease of native coronary artery with other forms of angina pectoris: Secondary | ICD-10-CM

## 2017-08-28 HISTORY — PX: LEFT HEART CATH AND CORONARY ANGIOGRAPHY: CATH118249

## 2017-08-28 SURGERY — LEFT HEART CATH AND CORONARY ANGIOGRAPHY
Anesthesia: LOCAL

## 2017-08-28 MED ORDER — HEPARIN (PORCINE) IN NACL 2-0.9 UNIT/ML-% IJ SOLN
INTRAMUSCULAR | Status: AC
  Filled 2017-08-28: qty 1000

## 2017-08-28 MED ORDER — ASPIRIN 81 MG PO CHEW: 81.0000 mg | CHEWABLE_TABLET | ORAL | Status: DC

## 2017-08-28 MED ORDER — VERAPAMIL HCL 2.5 MG/ML IV SOLN
INTRAVENOUS | Status: AC
  Filled 2017-08-28: qty 2

## 2017-08-28 MED ORDER — HEPARIN (PORCINE) IN NACL 2-0.9 UNIT/ML-% IJ SOLN
INTRAMUSCULAR | Status: AC | PRN
  Administered 2017-08-28: 1000 mL

## 2017-08-28 MED ORDER — SODIUM CHLORIDE 0.9 % IV SOLN
INTRAVENOUS | Status: DC
  Administered 2017-08-28: 11:00:00 via INTRAVENOUS

## 2017-08-28 MED ORDER — SODIUM CHLORIDE 0.9% FLUSH: 3.0000 mL | INTRAVENOUS | Status: DC | PRN

## 2017-08-28 MED ORDER — HEPARIN SODIUM (PORCINE) 1000 UNIT/ML IJ SOLN
INTRAMUSCULAR | Status: DC | PRN
  Administered 2017-08-28: 3500 [IU] via INTRAVENOUS

## 2017-08-28 MED ORDER — NITROGLYCERIN 1 MG/10 ML FOR IR/CATH LAB
INTRA_ARTERIAL | Status: AC
Start: 2017-08-28 — End: 2017-08-28
  Filled 2017-08-28: qty 10

## 2017-08-28 MED ORDER — MORPHINE SULFATE (PF) 10 MG/ML IV SOLN: 2.0000 mg | INTRAVENOUS | Status: DC | PRN

## 2017-08-28 MED ORDER — IOPAMIDOL (ISOVUE-370) INJECTION 76%
INTRAVENOUS | Status: DC | PRN
  Administered 2017-08-28: 75 mL via INTRA_ARTERIAL

## 2017-08-28 MED ORDER — VERAPAMIL HCL 2.5 MG/ML IV SOLN
INTRA_ARTERIAL | Status: DC | PRN
  Administered 2017-08-28: 5 mL via INTRA_ARTERIAL

## 2017-08-28 MED ORDER — LIDOCAINE HCL (PF) 1 % IJ SOLN
INTRAMUSCULAR | Status: AC
  Filled 2017-08-28: qty 30

## 2017-08-28 MED ORDER — HEPARIN SODIUM (PORCINE) 1000 UNIT/ML IJ SOLN
INTRAMUSCULAR | Status: AC
  Filled 2017-08-28: qty 1

## 2017-08-28 MED ORDER — ACETAMINOPHEN 325 MG PO TABS: 650.0000 mg | ORAL_TABLET | ORAL | Status: DC | PRN

## 2017-08-28 MED ORDER — SODIUM CHLORIDE 0.9 % IV SOLN: 250.0000 mL | INTRAVENOUS | Status: DC | PRN

## 2017-08-28 MED ORDER — SODIUM CHLORIDE 0.9% FLUSH: 3.0000 mL | Freq: Two times a day (BID) | INTRAVENOUS | Status: DC

## 2017-08-28 MED ORDER — ONDANSETRON HCL 4 MG/2ML IJ SOLN: 4.0000 mg | Freq: Four times a day (QID) | INTRAMUSCULAR | Status: DC | PRN

## 2017-08-28 MED ORDER — LIDOCAINE HCL (PF) 1 % IJ SOLN
INTRAMUSCULAR | Status: DC | PRN
  Administered 2017-08-28: 1 mL

## 2017-08-28 MED ORDER — ASPIRIN 81 MG PO CHEW: 81.0000 mg | CHEWABLE_TABLET | Freq: Every day | ORAL | Status: DC

## 2017-08-28 MED ORDER — SODIUM CHLORIDE 0.9 % IV SOLN: INTRAVENOUS | Status: AC

## 2017-08-28 SURGICAL SUPPLY — 12 items

## 2017-08-28 NOTE — Discharge Instructions (Signed)

## 2017-08-28 NOTE — Interval H&P Note (Signed)
Cath Lab Visit (complete for each Cath Lab visit)  Clinical Evaluation Leading to the Procedure:   ACS: No.  Non-ACS:    Anginal Classification: No Symptoms  Anti-ischemic medical therapy: No Therapy  Non-Invasive Test Results: Intermediate-risk stress test findings: cardiac mortality 1-3%/year  Prior CABG: No previous CABG      History and Physical Interval Note:  08/28/2017 1:03 PM  Jill Arellano  has presented today for surgery, with the diagnosis of abn stress   sob  The various methods of treatment have been discussed with the patient and family. After consideration of risks, benefits and other options for treatment, the patient has consented to  Procedure(s): LEFT HEART CATH AND CORONARY ANGIOGRAPHY (N/A) as a surgical intervention .  The patient's history has been reviewed, patient examined, no change in status, stable for surgery.  I have reviewed the patient's chart and labs.  Questions were answered to the patient's satisfaction.     [name], [name]

## 2017-08-29 ENCOUNTER — Encounter (HOSPITAL_COMMUNITY): Payer: Self-pay | Admitting: Cardiovascular Disease

## 2017-09-01 ENCOUNTER — Ambulatory Visit: Admission: RE | Admit: 2017-09-01 | Payer: Medicare PPO | Source: Ambulatory Visit | Admitting: Cardiovascular Disease

## 2017-09-01 ENCOUNTER — Encounter: Admission: RE | Payer: Self-pay | Source: Ambulatory Visit

## 2017-09-01 SURGERY — LEFT HEART CATH AND CORONARY ANGIOGRAPHY
Anesthesia: Moderate Sedation | Laterality: Left

## 2017-09-30 ENCOUNTER — Ambulatory Visit: Payer: Medicare PPO | Admitting: Cardiovascular Disease

## 2017-09-30 ENCOUNTER — Encounter: Payer: Self-pay | Admitting: Cardiovascular Disease

## 2017-09-30 VITALS — BP 130/60 | HR 71 | Ht 61.0 in | Wt 158.5 lb

## 2017-09-30 DIAGNOSIS — I2510 Atherosclerotic heart disease of native coronary artery without angina pectoris: Secondary | ICD-10-CM

## 2017-09-30 NOTE — Patient Instructions (Signed)
Medication Instructions: Stop taking Aspirin.   Labwork: None.   Procedures/Testing: None.   Follow-Up: As needed with Dr. Kirke CorinArida.   Any Additional Special Instructions Will Be Listed Below (If Applicable).     If you need a refill on your cardiac medications before your next appointment, please call your pharmacy.

## 2017-09-30 NOTE — Progress Notes (Signed)
Cardiology Office Note   Date:  09/30/2017   ID:  Jill Arellano, DOB 07/03/1951, MRN [id number]  PCP:  Allegra GranaArnett, Margaret G, FNP  Cardiologist:   Lorine BearsMuhammad Arida, MD   Chief Complaint  Patient presents with  . other    1 month f/u post cardiac cath. Meds reviewed verbally with pt.      History of Present Illness: Jill LabrumBonnie J Arellano is a 66 y.o. female who is here today for a follow-up visit regarding coronary atherosclerosis and abnormal treadmill stress test.   She was seen recently for evaluation of coronary atherosclerosis noted in CT scan of the lungs. She had a treadmill stress test that was positive with 1 mm of horizontal ST depression in the inferior leads.  She underwent cardiac catheterization last month which showed minimal luminal irregularities with no evidence of significant disease.  Ejection fraction was normal.  Past Medical History:  Diagnosis Date  . Chicken pox   . Vitamin D deficiency     Past Surgical History:  Procedure Laterality Date  . LEFT HEART CATH AND CORONARY ANGIOGRAPHY N/A 08/28/2017   Procedure: LEFT HEART CATH AND CORONARY ANGIOGRAPHY;  Surgeon: Runell GessBerry, Jonathan J, MD;  [facility];  Service: Cardiovascular;  Laterality: N/A;  . TUBAL LIGATION       Current Outpatient Medications  Medication Sig Dispense Refill  . aspirin EC 81 MG tablet Take 1 tablet (81 mg total) by mouth daily. 90 tablet 3  . cholecalciferol (EQL VITAMIN D-3) 400 units TABS tablet Take 800 Units daily by mouth.     No current facility-administered medications for this visit.     Allergies:   Patient has no known allergies.    Social History:  The patient  reports that she quit smoking about 10 years ago. Her smoking use included cigarettes. She has a 88.00 pack-year smoking history. She quit smokeless tobacco use about 22 years ago. She reports that she drinks about 0.6 oz of alcohol per week. She reports that she does not use drugs.   Family  History:  The patient's family history includes Cancer in her brother and sister; Heart disease in her mother and sister; Heart failure in her brother and mother.    ROS:  Please see the history of present illness.   Otherwise, review of systems are positive for none.   All other systems are reviewed and negative.    PHYSICAL EXAM: VS:  BP 130/60 (BP Location: Left Arm, Patient Position: Sitting, Cuff Size: Normal)   Pulse 71   Ht 5\' 1"  (1.549 m)   Wt 158 lb 8 oz (71.9 kg)   BMI 29.95 kg/m  , BMI Body mass index is 29.95 kg/m. GEN: Well nourished, well developed, in no acute distress  HEENT: normal  Neck: no JVD, carotid bruits, or masses Cardiac: RRR; no murmurs, rubs, or gallops,no edema  Respiratory:  clear to auscultation bilaterally, normal work of breathing GI: soft, nontender, nondistended, + BS MS: no deformity or atrophy  Skin: warm and dry, no rash Neuro:  Strength and sensation are intact Psych: euthymic mood, full affect Right radial pulse is normal with no hematoma  EKG:  EKG is   ordered today. EKG showed normal sinus rhythm with no significant ST or T wave changes.   Recent Labs: 05/21/2017: ALT 12; TSH 2.66 08/15/2017: BUN 16; Creatinine, Ser 0.51; Hemoglobin 14.1; Platelets 244; Potassium 4.1; Sodium 138    Lipid Panel    Component  Value Date/Time   CHOL 209 (H) 05/21/2017 0816   TRIG 156.0 (H) 05/21/2017 0816   HDL 86.80 05/21/2017 0816   CHOLHDL 2 05/21/2017 0816   VLDL 31.2 05/21/2017 0816   LDLCALC 91 05/21/2017 0816      Wt Readings from Last 3 Encounters:  09/30/17 158 lb 8 oz (71.9 kg)  08/28/17 155 lb (70.3 kg)  08/15/17 155 lb 12 oz (70.6 kg)      Other studies Reviewed:   PAD Screen 08/05/2017  Previous PAD dx? No  Previous surgical procedure? No  Pain with walking? No  Feet/toe relief with dangling? No  Painful, non-healing ulcers? No  Extremities discolored? No      ASSESSMENT AND PLAN:  1.   Coronary atherosclerosis  noted on CT scan.  However, cardiac catheterization showed only minimal disease.  Treadmill stress test was falsely positive.  I discontinued aspirin.  No indication for treatment with a statin at this time.   Disposition:   FU with me as needed.  Signed,  Lorine BearsMuhammad Arida, MD  09/30/2017 3:54 PM    [facility]

## 2017-11-27 ENCOUNTER — Telehealth: Payer: Self-pay | Admitting: Family

## 2017-11-27 DIAGNOSIS — I700 Atherosclerosis of aorta: Secondary | ICD-10-CM | POA: Insufficient documentation

## 2017-11-30 NOTE — Telephone Encounter (Signed)
Close note

## 2017-12-29 ENCOUNTER — Ambulatory Visit (INDEPENDENT_AMBULATORY_CARE_PROVIDER_SITE_OTHER): Payer: Medicare PPO

## 2017-12-29 VITALS — BP 130/70 | HR 71 | Temp 98.2°F | Resp 12 | Ht 61.0 in | Wt 159.8 lb

## 2017-12-29 DIAGNOSIS — Z0000 Encounter for general adult medical examination without abnormal findings: Secondary | ICD-10-CM

## 2017-12-29 NOTE — Progress Notes (Addendum)
Subjective:   Jill Arellano is a 67 y.o. female who presents for Medicare Annual (Subsequent) preventive examination.  Review of Systems:  No ROS.  Medicare Wellness Visit. Additional risk factors are reflected in the social history.  Cardiac Risk Factors include: advanced age (>56men, >45 women)     Objective:     Vitals: BP 130/70 (BP Location: Left Arm, Patient Position: Sitting, Cuff Size: Normal)   Pulse 71   Temp 98.2 F (36.8 C) (Oral)   Resp 12   Ht 5\' 1"  (1.549 m)   Wt 159 lb 12.8 oz (72.5 kg)   SpO2 98%   BMI 30.19 kg/m   Body mass index is 30.19 kg/m.  Advanced Directives 12/29/2017 08/28/2017 12/27/2016  Does Patient Have a Medical Advance Directive? Yes No No  Type of Estate agent of [place];Living will - -  Does patient want to make changes to medical advance directive? No - Patient declined - No - Patient declined  Copy of Healthcare Power of Attorney in Chart? No - copy requested - -  Would patient like information on creating a medical advance directive? - No - Patient declined -    Tobacco Social History   Tobacco Use  Smoking Status Former Smoker  . Packs/day: 2.00  . Years: 44.00  . Pack years: 88.00  . Types: Cigarettes  . Last attempt to quit: 2008  . Years since quitting: 11.2  Smokeless Tobacco Former Neurosurgeon  . Quit date: 08/10/1995     Counseling given: Not Answered   Clinical Intake:  Pre-visit preparation completed: Yes  Pain : No/denies pain     Nutritional Status: BMI > 30  Obese Diabetes: No  How often do you need to have someone help you when you read instructions, pamphlets, or other written materials from your doctor or pharmacy?: 1 - Never  Interpreter Needed?: No     Past Medical History:  Diagnosis Date  . Chicken pox   . Vitamin D deficiency    Past Surgical History:  Procedure Laterality Date  . LEFT HEART CATH AND CORONARY ANGIOGRAPHY N/A 08/28/2017   Procedure: LEFT HEART CATH  AND CORONARY ANGIOGRAPHY;  Surgeon: Runell Gess, MD;  Location: [facility];  Service: Cardiovascular;  Laterality: N/A;  . TUBAL LIGATION     Family History  Problem Relation Age of Onset  . Heart disease Mother        CHF  . Heart failure Mother   . Cancer Sister        lung  . Cancer Brother        colon  . Heart failure Brother   . Heart disease Sister        stent placement   Social History   Socioeconomic History  . Marital status: Single    Spouse name: None  . Number of children: None  . Years of education: None  . Highest education level: None  Social Needs  . Financial resource strain: Not hard at all  . Food insecurity - worry: Never true  . Food insecurity - inability: None  . Transportation needs - medical: No  . Transportation needs - non-medical: No  Occupational History  . None  Tobacco Use  . Smoking status: Former Smoker    Packs/day: 2.00    Years: 44.00    Pack years: 88.00    Types: Cigarettes    Last attempt to quit: 2008    Years since quitting: 11.2  .  Smokeless tobacco: Former Neurosurgeon    Quit date: 08/10/1995  Substance and Sexual Activity  . Alcohol use: Yes    Alcohol/week: 0.6 oz    Types: 1 Standard drinks or equivalent per week    Comment: beer OCC  . Drug use: No  . Sexual activity: No  Other Topics Concern  . 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 cholecalciferol (EQL VITAMIN D-3) 400 units TABS tablet Take 800 Units daily by mouth.   No facility-administered encounter medications on file as of 12/29/2017.     Activities of Daily Living In your present state of health, do you have any difficulty performing the following  activities: 12/29/2017 08/28/2017  Hearing? N N  Vision? N N  Difficulty concentrating or making decisions? N N  Walking or climbing stairs? N N  Dressing or bathing? N N  Doing errands, shopping? N -  Preparing Food and eating ? N -  Using the Toilet? N -  Managing your Medications? N -  Managing your Finances? N -  Housekeeping or managing your Housekeeping? N -  Some recent data might be hidden    Patient Care Team: Allegra Grana, FNP as PCP - General (Family Medicine)    Assessment:   This is a routine wellness examination for [place].  The goal of the wellness visit is to assist the patient how to close the gaps in care and create a preventative care plan for the patient.   The roster of all physicians providing medical care to patient is listed in the Snapshot section of the chart.  Taking calcium VIT D as appropriate/Osteoporosis risk reviewed.    Safety issues reviewed; Smoke and carbon monoxide detectors in the home. No firearms or firearms locked in a safe within the home. Wears seatbelts when driving or riding with others. No violence in the home.  They do not have excessive sun exposure.  Discussed the need for sun protection: hats, long sleeves and the use of sunscreen if there is significant sun exposure.  Patient is alert, normal appearance, oriented to person/place/and time. Correctly identified the president of the [place] and recalls of 3/3 words.  Performs simple calculations and can read correct time from watch face. Displays appropriate judgement.  No new identified risk were noted.  No failures at ADL's or IADL's.    BMI- discussed the importance of a healthy diet, water intake and the benefits of aerobic exercise. Educational material provided.   24 hour diet recall: Regular diet  Daily fluid intake: 2 cups of caffeine, 6 cups of water  Dental- dentures.   Eye- Visual acuity not assessed per patient preference since they have regular follow up with  the ophthalmologist.  Wears corrective lenses.  Sleep patterns- Sleeps through the night without issues.    Cologuard ordered per request.  Material provided for her to check with insurance for coverage.  Family history of colon cancer.  Exercise Activities and Dietary recommendations Current Exercise Habits: Home exercise routine, Type of exercise: treadmill, Time (Minutes): 20, Frequency (Times/Week): 4, Weekly Exercise (Minutes/Week): 80, Intensity:  Mild  Goals    . Lose weight     Low carb foods, healthy meal guide provided Walk for exercise       Fall Risk Fall Risk  12/29/2017 06/17/2017 05/13/2017 12/27/2016 01/31/2016  Falls in the past year? No No No No No   Depression Screen PHQ 2/9 Scores 12/29/2017 06/17/2017 05/13/2017 12/27/2016  PHQ - 2 Score 0 0 0 0     Cognitive Function MMSE - Mini Mental State Exam 12/27/2016  Orientation to time 5  Orientation to Place 5  Registration 3  Attention/ Calculation 5  Recall 3  Language- name 2 objects 2  Language- repeat 1  Language- follow 3 step command 3  Language- read & follow direction 1  Write a sentence 1  Copy design 1  Total score 30        Immunization History  Administered Date(s) Administered  . Influenza Split 08/28/2010  . Pneumococcal Conjugate-13 12/27/2016  . Pneumococcal Polysaccharide-23 11/22/2014  . Tdap 01/28/2011    Screening Tests Health Maintenance  Topic Date Due  . INFLUENZA VACCINE  08/31/2018 (Originally 05/14/2017)  . MAMMOGRAM  06/04/2019  . TETANUS/TDAP  01/27/2021  . COLONOSCOPY  03/16/2022  . DEXA SCAN  Completed  . Hepatitis C Screening  Completed  . PNA vac Low Risk Adult  Completed       Plan:    End of life planning; Advance aging; Advanced directives discussed. Copy of current HCPOA/Living Will requested.    I have personally reviewed and noted the following in the patient's chart:   . Medical and social history . Use of alcohol, tobacco or illicit drugs  . Current  medications and supplements . Functional ability and status . Nutritional status . Physical activity . Advanced directives . List of other physicians . Hospitalizations, surgeries, and ER visits in previous 12 months . Vitals . Screenings to include cognitive, depression, and falls . Referrals and appointments  In addition, I have reviewed and discussed with patient certain preventive protocols, quality metrics, and best practice recommendations. A written personalized care plan for preventive services as well as general preventive health recommendations were provided to patient.     Ashok PallOBrien-Blaney, Jeymi Hepp L, LPN  1/61/09603/18/2019   Agree with plan. Rennie PlowmanMargaret Arnett, NP

## 2017-12-29 NOTE — Patient Instructions (Addendum)
  Ms. Jill Arellano , Thank you for taking time to come for your Medicare Wellness Visit. I appreciate your ongoing commitment to your health goals. Please review the following plan we discussed and let me know if I can assist you in the future.   Follow up with Jill PlowmanMargaret Arnett FNP as needed.    Bring a copy of your Health Care Power of Attorney and/or Living Will to be scanned into chart.  Have a great day!  These are the goals we discussed: Goals    . Lose weight     Low carb foods, healthy meal guide provided Walk for exercise       This is a list of the screening recommended for you and due dates:  Health Maintenance  Topic Date Due  . Flu Shot  08/31/2018*  . Mammogram  06/04/2019  . Tetanus Vaccine  01/27/2021  . Colon Cancer Screening  03/16/2022  . DEXA scan (bone density measurement)  Completed  .  Hepatitis C: One time screening is recommended by Center for Disease Control  (CDC) for  adults born from [birth year] through 1965.   Completed  . Pneumonia vaccines  Completed  *Topic was postponed. The date shown is not the original due date.

## 2017-12-31 ENCOUNTER — Other Ambulatory Visit: Payer: Self-pay

## 2017-12-31 DIAGNOSIS — Z1211 Encounter for screening for malignant neoplasm of colon: Secondary | ICD-10-CM

## 2017-12-31 NOTE — Progress Notes (Signed)
Cologuard ordered and faxed.  

## 2018-01-13 DIAGNOSIS — Z1211 Encounter for screening for malignant neoplasm of colon: Secondary | ICD-10-CM | POA: Diagnosis not present

## 2018-01-14 LAB — COLOGUARD: COLOGUARD: NEGATIVE

## 2018-01-28 ENCOUNTER — Telehealth: Payer: Self-pay | Admitting: Family

## 2018-01-28 ENCOUNTER — Telehealth: Payer: Self-pay

## 2018-01-28 NOTE — Telephone Encounter (Signed)
Pt. Given cologuard results. Verbalizes understanding.

## 2018-01-28 NOTE — Telephone Encounter (Signed)
Left voice mail to call back ok for PEC to speak to patient 

## 2018-01-28 NOTE — Telephone Encounter (Signed)
cologuard came back negative , Left voice mail to call back ok for PEC to speak to patient

## 2018-01-28 NOTE — Telephone Encounter (Signed)
Call pt     let  know Cologuard NEGATIVE which indicates lower likelihood of cancer or pre cancer. You will need to repeat test in 3 years or sooner if any symptoms including blood in stool, change in bowel habits.   

## 2018-01-29 ENCOUNTER — Ambulatory Visit: Payer: Medicare PPO | Admitting: Podiatry

## 2018-01-29 ENCOUNTER — Encounter: Payer: Self-pay | Admitting: Podiatry

## 2018-01-29 DIAGNOSIS — M2041 Other hammer toe(s) (acquired), right foot: Secondary | ICD-10-CM | POA: Diagnosis not present

## 2018-01-29 DIAGNOSIS — M2011 Hallux valgus (acquired), right foot: Secondary | ICD-10-CM

## 2018-01-29 DIAGNOSIS — M2012 Hallux valgus (acquired), left foot: Secondary | ICD-10-CM | POA: Diagnosis not present

## 2018-01-29 NOTE — Progress Notes (Signed)
This patient presents the office with chief complaint of a redness and swelling on the outside of her big toe, right foot.  She says this area becomes painful after she works a shift at [employer].  She says this is becoming worse over time and she presents the office today for an evaluation of this condition.  She denies any history of trauma or injury.  She has sought no professional help nor self treatment for this condition.  General Appearance  Alert, conversant and in no acute stress.  Vascular  Dorsalis pedis and posterior tibial  pulses are palpable  bilaterally.  Capillary return is within normal limits  bilaterally. Temperature is within normal limits  bilaterally.  Neurologic  Senn-Weinstein monofilament wire test within normal limits  bilaterally. Muscle power within normal limits bilaterally.  Nails Thick disfigured discolored nails with subungual debris  from hallux to fifth toes bilaterally. No evidence of bacterial infection or drainage bilaterally.  Orthopedic  No limitations of motion of motion feet .  No crepitus or effusions noted.  HAV  B/L with overlapping second digit right foot.  Skin  normotropic skin with no porokeratosis noted bilaterally.  No signs of infections or ulcers noted.  Corn medial aspect second toe right foot.  HAV  With overlapping second toe right foot.  IE.  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  This is led the corn to form on the second toe as well as callus along the nail groove of the great toe, right foot. D iscussed this condition with this patient.  Dispensed padding to help separate the first and second digits right foot.  If this problem persists. Told this patient. She may need a surgical consult for the straightening of the bunion, right foot   Helane GuntherGregory Nanna Ertle DPM

## 2018-04-03 NOTE — Telephone Encounter (Signed)
Called and notified patient of cologuard was negative and she can repeat this in 3 years. Patient verbalized understanding of results.

## 2018-04-03 NOTE — Telephone Encounter (Signed)
Call pt  If no answer please mail results of cologuard to patient- see below. You may type of letter with info below

## 2018-05-25 ENCOUNTER — Telehealth: Payer: Self-pay | Admitting: Nurse Practitioner

## 2018-05-26 ENCOUNTER — Telehealth: Payer: Self-pay | Admitting: *Deleted

## 2018-05-26 NOTE — Telephone Encounter (Signed)
Patient called r/t CT screening of lung due at this time.  Patient voiced understanding and agreement with CT screening.  Appointment sent to scheduling for future appointment.    

## 2018-05-27 ENCOUNTER — Telehealth: Payer: Self-pay | Admitting: *Deleted

## 2018-05-27 DIAGNOSIS — Z87891 Personal history of nicotine dependence: Secondary | ICD-10-CM

## 2018-05-27 DIAGNOSIS — Z122 Encounter for screening for malignant neoplasm of respiratory organs: Secondary | ICD-10-CM

## 2018-05-27 NOTE — Telephone Encounter (Signed)
Patient has been notified that annual lung cancer screening low dose CT scan is due currently or will be in near future. Confirmed that patient is within the age range of 55-77, and asymptomatic, (no signs or symptoms of lung cancer). Patient denies illness that would prevent curative treatment for lung cancer if found. Verified smoking history, (former, quit 2008, 88 pack year). The shared decision making visit was done 06/03/17. Patient is agreeable for CT scan being scheduled.  

## 2018-05-28 ENCOUNTER — Telehealth: Payer: Self-pay | Admitting: *Deleted

## 2018-05-28 NOTE — Telephone Encounter (Signed)
Called patient with appt for CT screening for Tuesday 06/09/2018 @ 10:45am here @ [facility]. Voiced understanding.

## 2018-06-09 ENCOUNTER — Ambulatory Visit
Admission: RE | Admit: 2018-06-09 | Discharge: 2018-06-09 | Disposition: A | Discharge status: Home / Self Care | Payer: Medicare PPO | Source: Ambulatory Visit | Attending: Nurse Practitioner | Admitting: Nurse Practitioner

## 2018-06-09 DIAGNOSIS — J432 Centrilobular emphysema: Secondary | ICD-10-CM | POA: Diagnosis not present

## 2018-06-09 DIAGNOSIS — I2510 Atherosclerotic heart disease of native coronary artery without angina pectoris: Secondary | ICD-10-CM | POA: Insufficient documentation

## 2018-06-09 DIAGNOSIS — I700 Atherosclerosis of aorta: Secondary | ICD-10-CM | POA: Diagnosis not present

## 2018-06-09 DIAGNOSIS — J438 Other emphysema: Secondary | ICD-10-CM | POA: Insufficient documentation

## 2018-06-09 DIAGNOSIS — Z122 Encounter for screening for malignant neoplasm of respiratory organs: Secondary | ICD-10-CM | POA: Diagnosis not present

## 2018-06-09 DIAGNOSIS — Z87891 Personal history of nicotine dependence: Secondary | ICD-10-CM | POA: Insufficient documentation

## 2018-06-10 ENCOUNTER — Encounter: Payer: Self-pay | Admitting: Family

## 2018-06-10 ENCOUNTER — Telehealth: Payer: Self-pay | Admitting: Family

## 2018-06-10 DIAGNOSIS — I700 Atherosclerosis of aorta: Secondary | ICD-10-CM

## 2018-06-10 NOTE — Telephone Encounter (Signed)
close

## 2018-06-12 ENCOUNTER — Encounter: Payer: Self-pay | Admitting: Family

## 2018-06-12 ENCOUNTER — Ambulatory Visit (INDEPENDENT_AMBULATORY_CARE_PROVIDER_SITE_OTHER): Payer: Medicare PPO | Admitting: Family

## 2018-06-12 ENCOUNTER — Encounter: Payer: Self-pay | Admitting: *Deleted

## 2018-06-12 VITALS — BP 132/66 | HR 57 | Temp 98.1°F | Resp 15 | Ht 61.0 in | Wt 138.5 lb

## 2018-06-12 DIAGNOSIS — I700 Atherosclerosis of aorta: Secondary | ICD-10-CM

## 2018-06-12 DIAGNOSIS — Z0000 Encounter for general adult medical examination without abnormal findings: Secondary | ICD-10-CM | POA: Diagnosis not present

## 2018-06-12 LAB — HEMOGLOBIN A1C: Hgb A1c MFr Bld: 5.4 % (ref 4.6–6.5)

## 2018-06-12 LAB — COMPREHENSIVE METABOLIC PANEL
ALK PHOS: 54 U/L (ref 39–117)
ALT: 12 U/L (ref 0–35)
AST: 13 U/L (ref 0–37)
Albumin: 4.5 g/dL (ref 3.5–5.2)
BUN: 15 mg/dL (ref 6–23)
CALCIUM: 9.6 mg/dL (ref 8.4–10.5)
CO2: 29 mEq/L (ref 19–32)
Chloride: 104 mEq/L (ref 96–112)
Creatinine, Ser: 0.73 mg/dL (ref 0.40–1.20)
GFR: 84.04 mL/min (ref >60.00)
GLUCOSE: 97 mg/dL (ref 70–99)
POTASSIUM: 4.7 meq/L (ref 3.5–5.1)
Sodium: 139 mEq/L (ref 135–145)
Total Bilirubin: 0.4 mg/dL (ref 0.2–1.2)
Total Protein: 7 g/dL (ref 6.0–8.3)

## 2018-06-12 LAB — CBC WITH DIFFERENTIAL/PLATELET
BASOS PCT: 0.6 % (ref 0.0–3.0)
Basophils Absolute: 0 10*3/uL (ref 0.0–0.1)
EOS PCT: 2.8 % (ref 0.0–5.0)
Eosinophils Absolute: 0.1 10*3/uL (ref 0.0–0.7)
HEMATOCRIT: 40.6 % (ref 36.0–46.0)
Hemoglobin: 13.8 g/dL (ref 12.0–15.0)
LYMPHS ABS: 2.3 10*3/uL (ref 0.7–4.0)
Lymphocytes Relative: 42.9 % (ref 12.0–46.0)
MCHC: 34 g/dL (ref 30.0–36.0)
MCV: 89.9 fl (ref 78.0–100.0)
MONOS PCT: 8.4 % (ref 3.0–12.0)
Monocytes Absolute: 0.4 10*3/uL (ref 0.1–1.0)
NEUTROS ABS: 2.4 10*3/uL (ref 1.4–7.7)
Neutrophils Relative %: 45.3 % (ref 43.0–77.0)
PLATELETS: 228 10*3/uL (ref 150.0–400.0)
RBC: 4.52 Mil/uL (ref 3.87–5.11)
RDW: 12.6 % (ref 11.5–15.5)
WBC: 5.3 10*3/uL (ref 4.0–10.5)

## 2018-06-12 LAB — LIPID PANEL
CHOL/HDL RATIO: 3
CHOLESTEROL: 233 mg/dL — AB (ref 0–200)
HDL: 91.5 mg/dL (ref >39.00)
LDL Cholesterol: 126 mg/dL — ABNORMAL HIGH (ref 0–99)
NonHDL: 141.15
TRIGLYCERIDES: 74 mg/dL (ref 0.0–149.0)
VLDL: 14.8 mg/dL (ref 0.0–40.0)

## 2018-06-12 LAB — VITAMIN D 25 HYDROXY (VIT D DEFICIENCY, FRACTURES): VITD: 29.04 ng/mL — AB (ref 30.00–100.00)

## 2018-06-12 LAB — TSH: TSH: 2.15 u[IU]/mL (ref 0.35–4.50)

## 2018-06-12 NOTE — Assessment & Plan Note (Addendum)
Discussed again after seeing on CT chest. She feels reassured by cardiac work up by Dr Kirke CorinArida in 2018.  She politely declined cholesterol medication at this time.  We will continue to monitor

## 2018-06-12 NOTE — Progress Notes (Signed)
Subjective:    Patient ID: Jill Arellano, female    DOB: [birth year], 67 y.o.   MRN: [id number]  CC: Jill Arellano is a 67 y.o. female who presents today for physical exam.    HPI: Doing well today. No complaints.   atherosclerosis- known. declines cholesterol medication.      09/2017 - Dr Kirke Corin. Had angiogram- normal.    Colorectal Cancer Screening: Cologuard negative 01/2018. Last colonoscopy 2013.  Unsure of repeat, thought 5 year repeat. Has h/o colon polyps and brother has colon cancer.  Breast Cancer Screening: Mammogram due Cervical Cancer Screening: no h/o abnormal pap. No vaginal bleeding.  Bone Health screening/DEXA for 65+: osteopenia, due.  Lung Cancer Screening:done this year       Tetanus - utd       Labs: Screening labs today. Exercise: Gets regular exercise.  Alcohol use: occasional  Smoking/tobacco use: former smoker.  Wears seat belt: Yes.  HISTORY:  Past Medical History:  Diagnosis Date  . Chicken pox   . Vitamin D deficiency     Past Surgical History:  Procedure Laterality Date  . LEFT HEART CATH AND CORONARY ANGIOGRAPHY N/A 08/28/2017   Procedure: LEFT HEART CATH AND CORONARY ANGIOGRAPHY;  Surgeon: Runell Gess, MD;  Location: [facility];  Service: Cardiovascular;  Laterality: N/A;  . TUBAL LIGATION     Family History  Problem Relation Age of Onset  . Heart disease Mother        CHF  . Heart failure Mother   . Cancer Sister        lung  . Heart failure Brother   . Colon cancer Brother 38       colon  . Heart disease Sister        stent placement      ALLERGIES: Patient has no known allergies.  Current Outpatient Medications on File Prior to Visit  Medication Sig Dispense Refill  . cholecalciferol (EQL VITAMIN D-3) 400 units TABS tablet Take 800 Units by mouth daily.      No current facility-administered medications on file prior to visit.     Social History   Tobacco Use  . Smoking status: Former Smoker   Packs/day: 2.00    Years: 44.00    Pack years: 88.00    Types: Cigarettes    Last attempt to quit: 2008    Years since quitting: 11.6  . Smokeless tobacco: Former Neurosurgeon    Quit date: 08/10/1995  Substance Use Topics  . Alcohol use: Yes    Alcohol/week: 1.0 standard drinks    Types: 1 Standard drinks or equivalent per week    Comment: beer OCC  . Drug use: No    Review of Systems  Constitutional: Negative for chills, fever and unexpected weight change.  HENT: Negative for congestion.   Respiratory: Negative for cough.   Cardiovascular: Negative for chest pain, palpitations and leg swelling.  Gastrointestinal: Negative for abdominal pain, nausea and vomiting.  Genitourinary: Negative for vaginal bleeding.  Musculoskeletal: Negative for arthralgias and myalgias.  Skin: Negative for rash.  Neurological: Negative for headaches.  Hematological: Negative for adenopathy.  Psychiatric/Behavioral: Negative for confusion.      Objective:    BP 132/66 (BP Location: Left Arm, Patient Position: Sitting, Cuff Size: Normal)   Pulse (!) 57   Temp 98.1 F (36.7 C) (Oral)   Resp 15   Ht 5\' 1"  (1.549 m)   Wt 138 lb 8 oz (62.8 kg)  SpO2 97%   BMI 26.17 kg/m   BP Readings from Last 3 Encounters:  06/12/18 132/66  12/29/17 130/70  09/30/17 130/60   Wt Readings from Last 3 Encounters:  06/12/18 138 lb 8 oz (62.8 kg)  06/09/18 136 lb (61.7 kg)  12/29/17 159 lb 12.8 oz (72.5 kg)    Physical Exam  Constitutional: She appears well-developed and well-nourished.  Eyes: Conjunctivae are normal.  Neck: No thyroid mass and no thyromegaly present.  Cardiovascular: Normal rate, regular rhythm, normal heart sounds and normal pulses.  Pulmonary/Chest: Effort normal and breath sounds normal. She has no wheezes. She has no rhonchi. She has no rales. Right breast exhibits no inverted nipple, no mass, no nipple discharge, no skin change and no tenderness. Left breast exhibits no inverted nipple, no  mass, no nipple discharge, no skin change and no tenderness. Breasts are symmetrical.  CBE performed.   Lymphadenopathy:       Head (right side): No submental, no submandibular, no tonsillar, no preauricular, no posterior auricular and no occipital adenopathy present.       Head (left side): No submental, no submandibular, no tonsillar, no preauricular, no posterior auricular and no occipital adenopathy present.    She has no cervical adenopathy.       Right cervical: No superficial cervical, no deep cervical and no posterior cervical adenopathy present.      Left cervical: No superficial cervical, no deep cervical and no posterior cervical adenopathy present.    She has no axillary adenopathy.  Neurological: She is alert.  Skin: Skin is warm and dry.  Psychiatric: She has a normal mood and affect. Her speech is normal and behavior is normal. Thought content normal.  Vitals reviewed.      Assessment & Plan:   Problem List Items Addressed This Visit      Cardiovascular and Mediastinum   Atherosclerosis of aorta [facility](HCC)    Discussed again after seeing on CT chest. She feels reassured by cardiac work up by Dr Kirke CorinArida in 2018.  She politely declined cholesterol medication at this time.  We will continue to monitor        Other   Routine general medical examination at a health care facility - Primary    Deferred pelvic exam in the absence of complaints.  She no longer screening with Pap smear.  Mammogram, bone density ordered.  Discussed with patient brother's family history of colon cancer as well as her personal history of polyps, and advised against Cologuard screening in the future.  Patient is very understanding of this.  She would like to see gastroenterology for colonoscopy which based on her last colonoscopy, likely is due.  Pending medical records for this.  Referral placed for GI/       Relevant Orders   MM 3D SCREEN BREAST BILATERAL   Ambulatory referral to Gastroenterology   CBC  with Differential/Platelet   Comprehensive metabolic panel   Hemoglobin A1c   Lipid panel   TSH   VITAMIN D 25 Hydroxy (Vit-D Deficiency, Fractures)   DG Bone Density       I am having Jill Arellano maintain her cholecalciferol.   No orders of the defined types were placed in this encounter.   Return precautions given.   Risks, benefits, and alternatives of the medications and treatment plan prescribed today were discussed, and patient expressed understanding.   Education regarding symptom management and diagnosis given to patient on AVS.   Continue to follow with Loomis Anacker,  Lyn Records, FNP for routine health maintenance.   [name] and I agreed with plan.   Rennie Plowman, FNP

## 2018-06-12 NOTE — Assessment & Plan Note (Signed)
Deferred pelvic exam in the absence of complaints.  She no longer screening with Pap smear.  Mammogram, bone density ordered.  Discussed with patient brother's family history of colon cancer as well as her personal history of polyps, and advised against Cologuard screening in the future.  Patient is very understanding of this.  She would like to see gastroenterology for colonoscopy which based on her last colonoscopy, likely is due.  Pending medical records for this.  Referral placed for GI/

## 2018-06-12 NOTE — Patient Instructions (Addendum)
Medical record form for colonoscopy. Let me know if you dont hear from our office in 6 weeks confirming receipt.   Today we discussed referrals, orders. GI for colonoscopy    I have placed these orders in the system for you.  Please be sure to give [place] a call if you have not heard from our office regarding scheduling a test or regarding referral in a timely manner.  It is very important that you let me know as soon as possible.    We placed a referral for mammogram, bone density this year. I asked that you call [facility] and schedule this when it is convenient for you.   As discussed, I would like you to ask for 3D mammogram over the traditional 2D mammogram as new evidence suggest 3D is superior.     [place]  [place], [place]  * Offers 3D mammogram if you ask*     Health Maintenance for Postmenopausal Women Menopause is a normal process in which your reproductive ability comes to an end. This process happens gradually over a span of months to years, usually between the ages of 34 and 64. Menopause is complete when you have missed 12 consecutive menstrual periods. It is important to talk with your health care provider about some of the most common conditions that affect postmenopausal women, such as heart disease, cancer, and bone loss (osteoporosis). Adopting a healthy lifestyle and getting preventive care can help to promote your health and wellness. Those actions can also lower your chances of developing some of these common conditions. What should I know about menopause? During menopause, you may experience a number of symptoms, such as:  Moderate-to-severe hot flashes.  Night sweats.  Decrease in sex drive.  Mood swings.  Headaches.  Tiredness.  Irritability.  Memory problems.  Insomnia.  Choosing to treat or not to treat menopausal changes is an individual decision that you make with your health care  provider. What should I know about hormone replacement therapy and supplements? Hormone therapy products are effective for treating symptoms that are associated with menopause, such as hot flashes and night sweats. Hormone replacement carries certain risks, especially as you become older. If you are thinking about using estrogen or estrogen with progestin treatments, discuss the benefits and risks with your health care provider. What should I know about heart disease and stroke? Heart disease, heart attack, and stroke become more likely as you age. This may be due, in part, to the hormonal changes that your body experiences during menopause. These can affect how your body processes dietary fats, triglycerides, and cholesterol. Heart attack and stroke are both medical emergencies. There are many things that you can do to help prevent heart disease and stroke:  Have your blood pressure checked at least every 1-2 years. High blood pressure causes heart disease and increases the risk of stroke.  If you are 36-55 years old, ask your health care provider if you should take aspirin to prevent a heart attack or a stroke.  Do not use any tobacco products, including cigarettes, chewing tobacco, or electronic cigarettes. If you need help quitting, ask your health care provider.  It is important to eat a healthy diet and maintain a healthy weight. ? Be sure to include plenty of vegetables, fruits, low-fat dairy products, and lean protein. ? Avoid eating foods that are high in solid fats, added sugars, or salt (sodium).  Get regular exercise. This is one  of the most important things that you can do for your health. ? Try to exercise for at least 150 minutes each week. The type of exercise that you do should increase your heart rate and make you sweat. This is known as moderate-intensity exercise. ? Try to do strengthening exercises at least twice each week. Do these in addition to the moderate-intensity  exercise.  Know your numbers.Ask your health care provider to check your cholesterol and your blood glucose. Continue to have your blood tested as directed by your health care provider.  What should I know about cancer screening? There are several types of cancer. Take the following steps to reduce your risk and to catch any cancer development as early as possible. Breast Cancer  Practice breast self-awareness. ? This means understanding how your breasts normally appear and feel. ? It also means doing regular breast self-exams. Let your health care provider know about any changes, no matter how small.  If you are 58 or older, have a clinician do a breast exam (clinical breast exam or CBE) every year. Depending on your age, family history, and medical history, it may be recommended that you also have a yearly breast X-ray (mammogram).  If you have a family history of breast cancer, talk with your health care provider about genetic screening.  If you are at high risk for breast cancer, talk with your health care provider about having an MRI and a mammogram every year.  Breast cancer (BRCA) gene test is recommended for women who have family members with BRCA-related cancers. Results of the assessment will determine the need for genetic counseling and BRCA1 and for BRCA2 testing. BRCA-related cancers include these types: ? Breast. This occurs in males or females. ? Ovarian. ? Tubal. This may also be called fallopian tube cancer. ? Cancer of the abdominal or pelvic lining (peritoneal cancer). ? Prostate. ? Pancreatic.  Cervical, Uterine, and Ovarian Cancer Your health care provider may recommend that you be screened regularly for cancer of the pelvic organs. These include your ovaries, uterus, and vagina. This screening involves a pelvic exam, which includes checking for microscopic changes to the surface of your cervix (Pap test).  For women ages 21-65, health care providers may recommend a  pelvic exam and a Pap test every three years. For women ages 23-65, they may recommend the Pap test and pelvic exam, combined with testing for human papilloma virus (HPV), every five years. Some types of HPV increase your risk of cervical cancer. Testing for HPV may also be done on women of any age who have unclear Pap test results.  Other health care providers may not recommend any screening for nonpregnant women who are considered low risk for pelvic cancer and have no symptoms. Ask your health care provider if a screening pelvic exam is right for you.  If you have had past treatment for cervical cancer or a condition that could lead to cancer, you need Pap tests and screening for cancer for at least 20 years after your treatment. If Pap tests have been discontinued for you, your risk factors (such as having a new sexual partner) need to be reassessed to determine if you should start having screenings again. Some women have medical problems that increase the chance of getting cervical cancer. In these cases, your health care provider may recommend that you have screening and Pap tests more often.  If you have a family history of uterine cancer or ovarian cancer, talk with your health care  provider about genetic screening.  If you have vaginal bleeding after reaching menopause, tell your health care provider.  There are currently no reliable tests available to screen for ovarian cancer.  Lung Cancer Lung cancer screening is recommended for adults 73-102 years old who are at high risk for lung cancer because of a history of smoking. A yearly low-dose CT scan of the lungs is recommended if you:  Currently smoke.  Have a history of at least 30 pack-years of smoking and you currently smoke or have quit within the past 15 years. A pack-year is smoking an average of one pack of cigarettes per day for one year.  Yearly screening should:  Continue until it has been 15 years since you quit.  Stop if  you develop a health problem that would prevent you from having lung cancer treatment.  Colorectal Cancer  This type of cancer can be detected and can often be prevented.  Routine colorectal cancer screening usually begins at age 32 and continues through age 2.  If you have risk factors for colon cancer, your health care provider may recommend that you be screened at an earlier age.  If you have a family history of colorectal cancer, talk with your health care provider about genetic screening.  Your health care provider may also recommend using home test kits to check for hidden blood in your stool.  A small camera at the end of a tube can be used to examine your colon directly (sigmoidoscopy or colonoscopy). This is done to check for the earliest forms of colorectal cancer.  Direct examination of the colon should be repeated every 5-10 years until age 55. However, if early forms of precancerous polyps or small growths are found or if you have a family history or genetic risk for colorectal cancer, you may need to be screened more often.  Skin Cancer  Check your skin from head to toe regularly.  Monitor any moles. Be sure to tell your health care provider: ? About any new moles or changes in moles, especially if there is a change in a mole's shape or color. ? If you have a mole that is larger than the size of a pencil eraser.  If any of your family members has a history of skin cancer, especially at a young age, talk with your health care provider about genetic screening.  Always use sunscreen. Apply sunscreen liberally and repeatedly throughout the day.  Whenever you are outside, protect yourself by wearing long sleeves, pants, a wide-brimmed hat, and sunglasses.  What should I know about osteoporosis? Osteoporosis is a condition in which bone destruction happens more quickly than new bone creation. After menopause, you may be at an increased risk for osteoporosis. To help prevent  osteoporosis or the bone fractures that can happen because of osteoporosis, the following is recommended:  If you are 79-104 years old, get at least 1,000 mg of calcium and at least 600 mg of vitamin D per day.  If you are older than age 58 but younger than age 53, get at least 1,200 mg of calcium and at least 600 mg of vitamin D per day.  If you are older than age 11, get at least 1,200 mg of calcium and at least 800 mg of vitamin D per day.  Smoking and excessive alcohol intake increase the risk of osteoporosis. Eat foods that are rich in calcium and vitamin D, and do weight-bearing exercises several times each week as directed by your  health care provider. What should I know about how menopause affects my mental health? Depression may occur at any age, but it is more common as you become older. Common symptoms of depression include:  Low or sad mood.  Changes in sleep patterns.  Changes in appetite or eating patterns.  Feeling an overall lack of motivation or enjoyment of activities that you previously enjoyed.  Frequent crying spells.  Talk with your health care provider if you think that you are experiencing depression. What should I know about immunizations? It is important that you get and maintain your immunizations. These include:  Tetanus, diphtheria, and pertussis (Tdap) booster vaccine.  Influenza every year before the flu season begins.  Pneumonia vaccine.  Shingles vaccine.  Your health care provider may also recommend other immunizations. This information is not intended to replace advice given to you by your health care provider. Make sure you discuss any questions you have with your health care provider. Document Released: 11/22/2005 Document Revised: 04/19/2016 Document Reviewed: 07/04/2015 Elsevier Interactive Patient Education  2018 [employer].

## 2018-07-17 ENCOUNTER — Other Ambulatory Visit: Payer: Self-pay | Admitting: Family

## 2018-07-17 ENCOUNTER — Encounter: Payer: Self-pay | Admitting: Family

## 2018-07-17 DIAGNOSIS — Z1211 Encounter for screening for malignant neoplasm of colon: Secondary | ICD-10-CM

## 2018-07-21 ENCOUNTER — Ambulatory Visit
Admission: RE | Admit: 2018-07-21 | Discharge: 2018-07-21 | Disposition: A | Discharge status: Home / Self Care | Payer: Medicare PPO | Source: Ambulatory Visit | Attending: Family | Admitting: Family

## 2018-07-21 DIAGNOSIS — Z0000 Encounter for general adult medical examination without abnormal findings: Secondary | ICD-10-CM | POA: Diagnosis not present

## 2018-07-21 DIAGNOSIS — Z1231 Encounter for screening mammogram for malignant neoplasm of breast: Secondary | ICD-10-CM | POA: Insufficient documentation

## 2018-07-21 DIAGNOSIS — Z13820 Encounter for screening for osteoporosis: Secondary | ICD-10-CM | POA: Insufficient documentation

## 2018-07-21 DIAGNOSIS — Z780 Asymptomatic menopausal state: Secondary | ICD-10-CM | POA: Diagnosis not present

## 2018-07-21 DIAGNOSIS — M85852 Other specified disorders of bone density and structure, left thigh: Secondary | ICD-10-CM | POA: Insufficient documentation

## 2018-07-25 ENCOUNTER — Encounter: Payer: Self-pay | Admitting: Family

## 2018-07-25 DIAGNOSIS — M8580 Other specified disorders of bone density and structure, unspecified site: Secondary | ICD-10-CM | POA: Insufficient documentation

## 2018-08-04 ENCOUNTER — Ambulatory Visit: Payer: Medicare PPO | Admitting: Gastroenterology

## 2018-08-05 ENCOUNTER — Encounter: Payer: Self-pay | Admitting: *Deleted

## 2018-12-16 IMAGING — MG MM DIGITAL SCREENING BILAT W/ TOMO W/ CAD
8 series · 9 of 24 positions shown · non-contrast
Comparison: Previous exam(s).

CLINICAL DATA: Screening.

EXAM:
DIGITAL SCREENING BILATERAL MAMMOGRAM WITH TOMO AND CAD

[R MLO synth-2D]
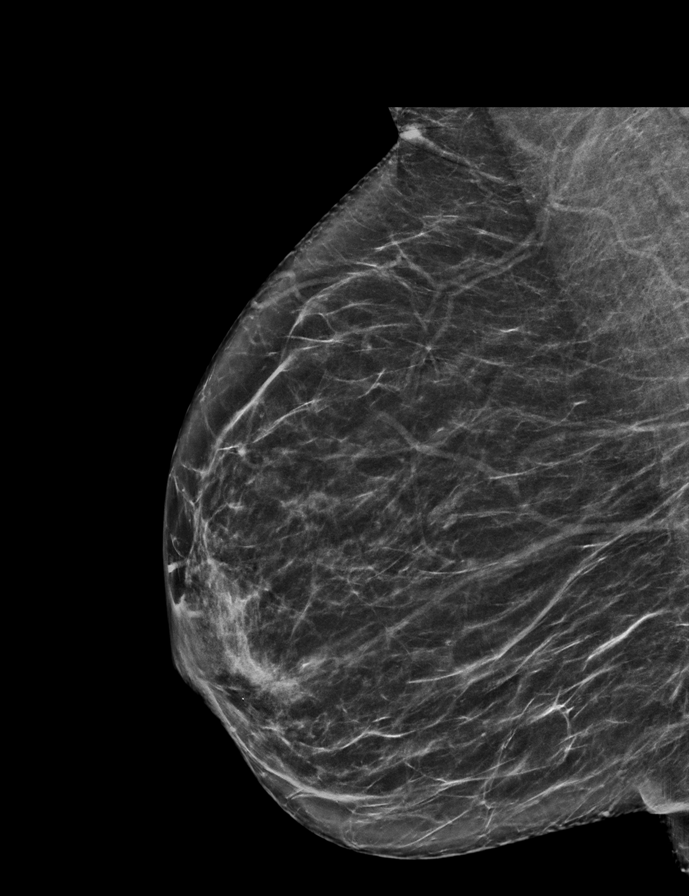

[R CC synth-2D]
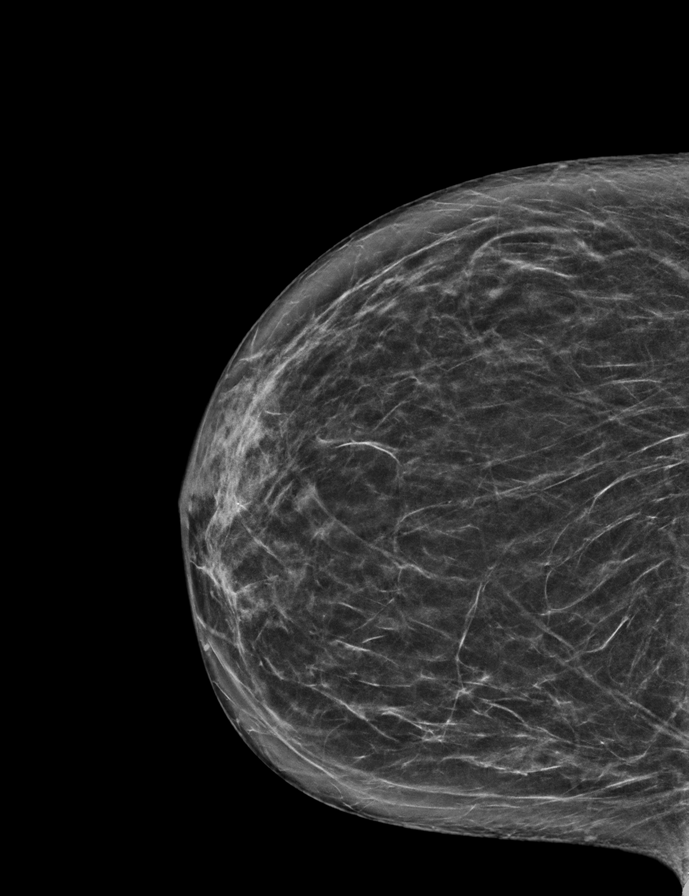

[L MLO synth-2D]
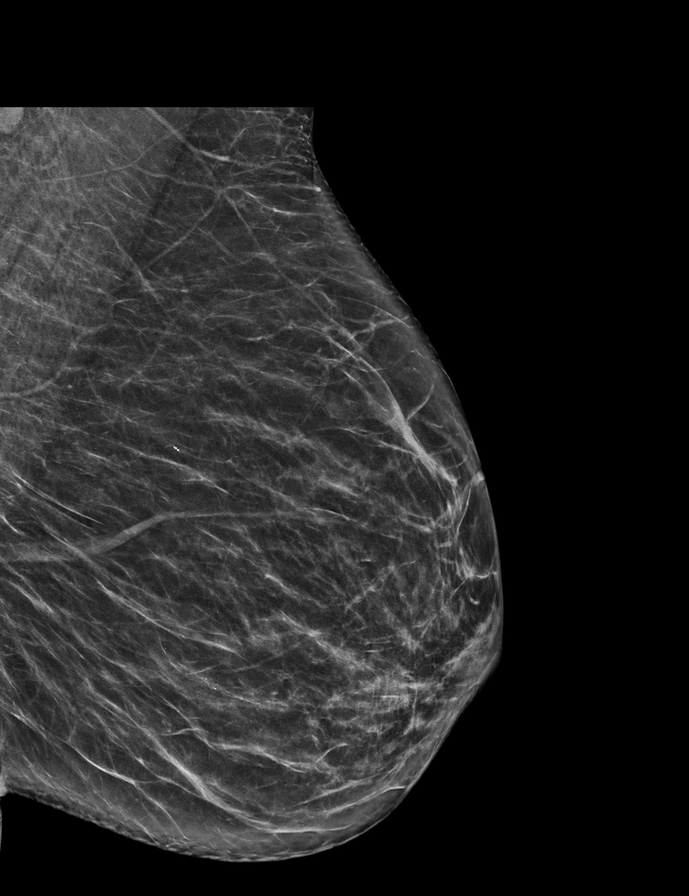

[L CC synth-2D]
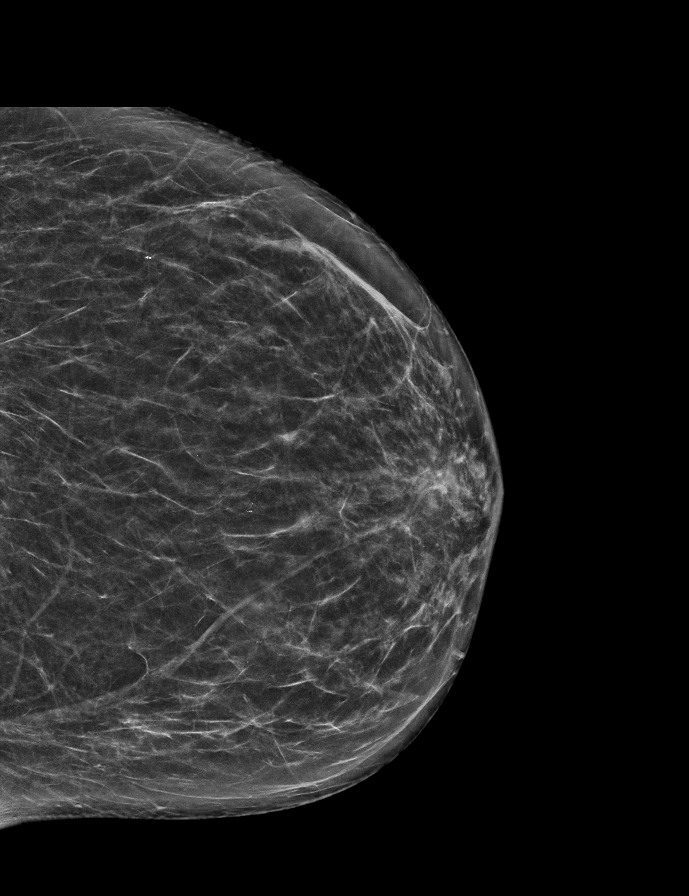

[L CC tomo · 2 of 59 frames shown]
[frame 20/59]
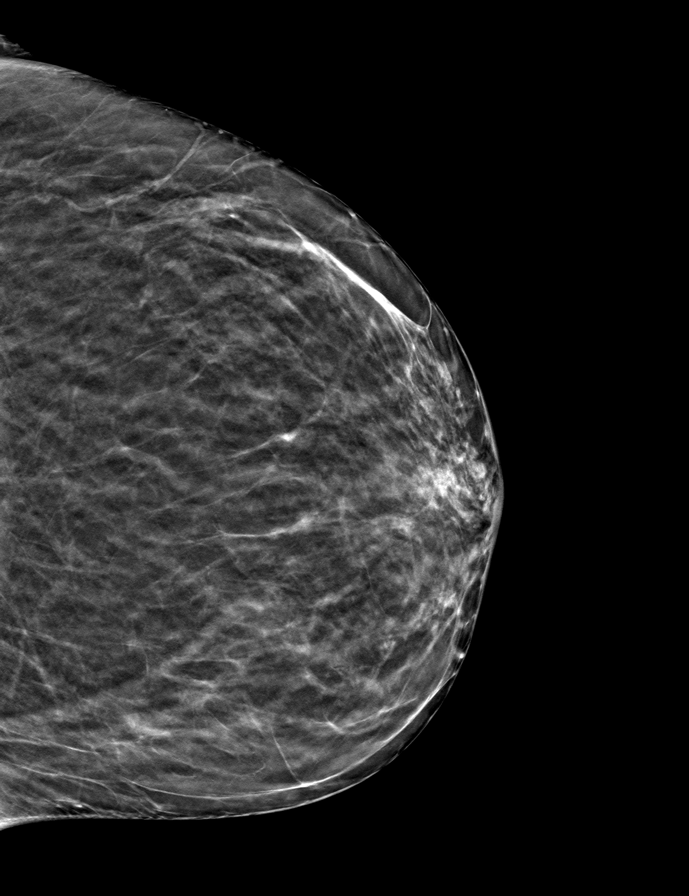
[frame 30/59]
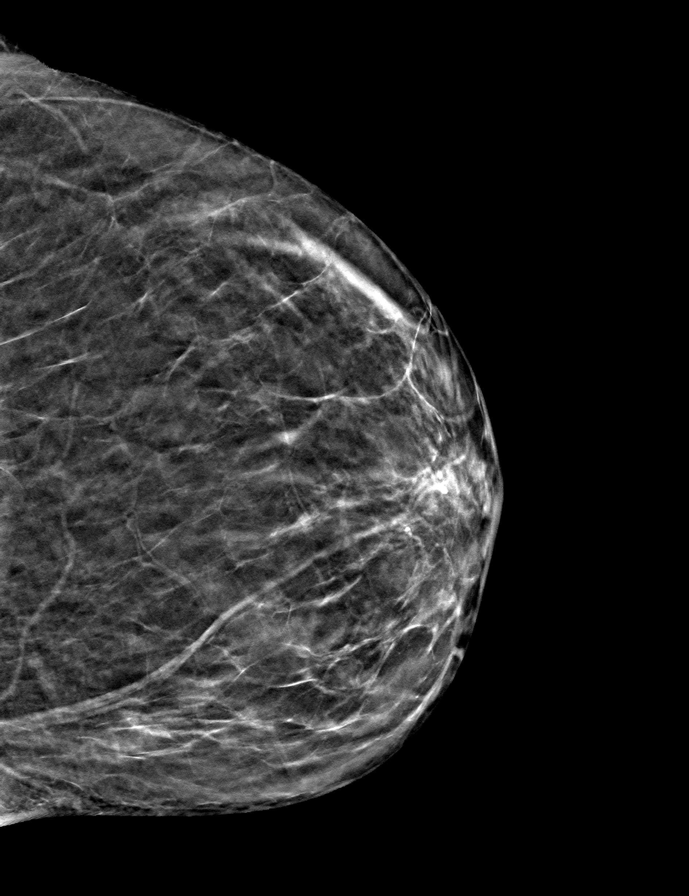

[L MLO tomo · tomo slice 33/64.0]
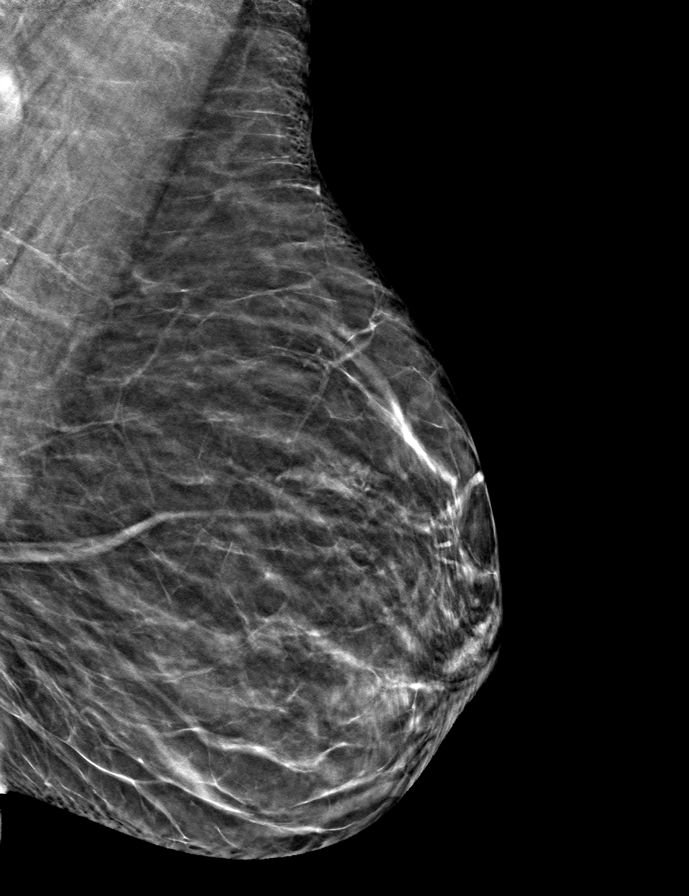

[R MLO tomo · tomo slice 33/65.0]
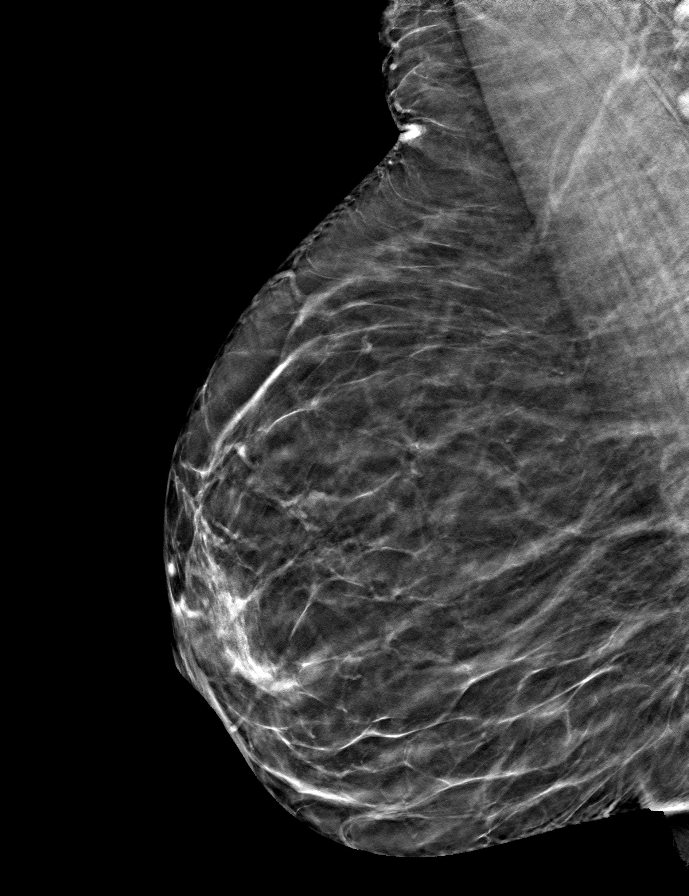

[R CC tomo · tomo slice 31/62.0]
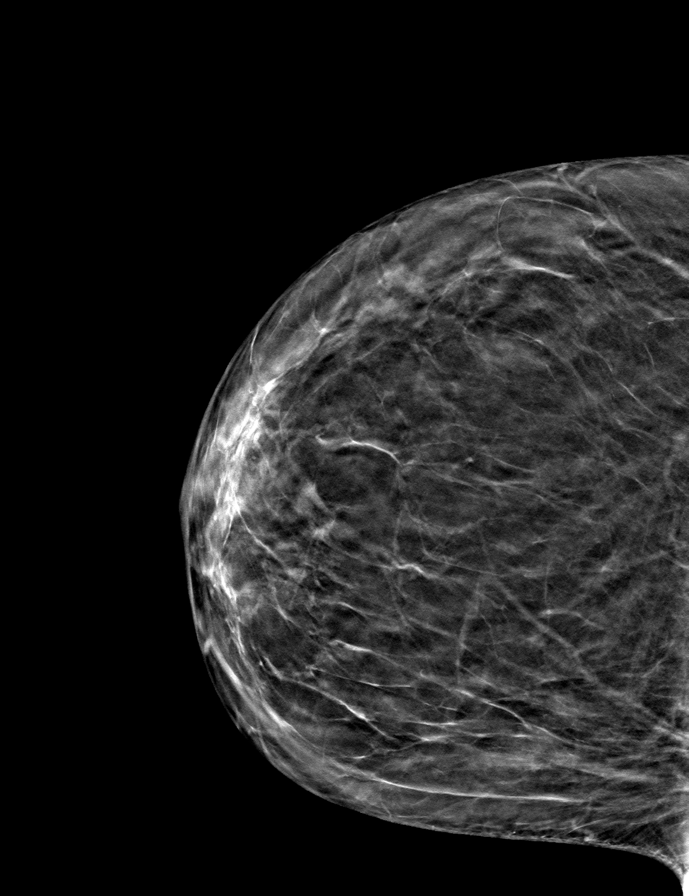

[9 of 24 positions shown; findings below may reference images not displayed]

ACR Breast Density Category b: There are scattered areas of
fibroglandular density.
FINDINGS: There are no findings suspicious for malignancy. Images were
processed with CAD.
IMPRESSION: No mammographic evidence of malignancy. A result letter of this
screening mammogram will be mailed directly to the patient.

RECOMMENDATION:
Screening mammogram in one year. (Code:[id number])

BI-RADS CATEGORY  1: Negative.

## 2018-12-30 ENCOUNTER — Ambulatory Visit: Payer: Medicare PPO

## 2019-06-22 ENCOUNTER — Telehealth: Payer: Self-pay | Admitting: *Deleted

## 2019-06-22 NOTE — Telephone Encounter (Signed)
Left message for patient to notify them that it is time to schedule annual low dose lung cancer screening CT scan. Instructed patient to call back to verify information prior to the scan being scheduled.  

## 2019-06-28 ENCOUNTER — Telehealth: Payer: Self-pay | Admitting: *Deleted

## 2019-06-28 DIAGNOSIS — Z122 Encounter for screening for malignant neoplasm of respiratory organs: Secondary | ICD-10-CM

## 2019-06-28 DIAGNOSIS — Z87891 Personal history of nicotine dependence: Secondary | ICD-10-CM

## 2019-06-28 NOTE — Telephone Encounter (Signed)
Patient has been notified that annual lung cancer screening low dose CT scan is due currently or will be in near future. Confirmed that patient is within the age range of 55-77, and asymptomatic, (no signs or symptoms of lung cancer). Patient denies illness that would prevent curative treatment for lung cancer if found. Verified smoking history, (former, quit 2008, 88 pack year). The shared decision making visit was done 06/03/17. Patient is agreeable for CT scan being scheduled.

## 2019-07-05 ENCOUNTER — Other Ambulatory Visit: Payer: Self-pay

## 2019-07-05 ENCOUNTER — Ambulatory Visit
Admission: RE | Admit: 2019-07-05 | Discharge: 2019-07-05 | Disposition: A | Discharge status: Home / Self Care | Payer: Medicare PPO | Source: Ambulatory Visit | Attending: Oncology | Admitting: Oncology

## 2019-07-05 DIAGNOSIS — Z122 Encounter for screening for malignant neoplasm of respiratory organs: Secondary | ICD-10-CM | POA: Insufficient documentation

## 2019-07-05 DIAGNOSIS — Z87891 Personal history of nicotine dependence: Secondary | ICD-10-CM | POA: Diagnosis present

## 2019-07-07 ENCOUNTER — Encounter: Payer: Self-pay | Admitting: *Deleted

## 2019-07-07 NOTE — Progress Notes (Signed)
Copd noted on ct chest  Plaque build up in aorta  F/u in 1 year   [place]

## 2019-07-09 NOTE — Progress Notes (Signed)
Patient notified verbalized understanding

## 2019-07-15 HISTORY — PX: CATARACT EXTRACTION, BILATERAL: SHX1313

## 2019-07-22 ENCOUNTER — Other Ambulatory Visit: Payer: Self-pay

## 2019-07-26 ENCOUNTER — Encounter: Payer: Self-pay | Admitting: Family Medicine

## 2019-07-26 ENCOUNTER — Other Ambulatory Visit: Payer: Self-pay

## 2019-07-26 ENCOUNTER — Ambulatory Visit (INDEPENDENT_AMBULATORY_CARE_PROVIDER_SITE_OTHER): Payer: Medicare PPO | Admitting: Family Medicine

## 2019-07-26 VITALS — BP 140/60 | HR 77 | Temp 98.3°F | Ht 61.0 in | Wt 154.2 lb

## 2019-07-26 DIAGNOSIS — Z800 Family history of malignant neoplasm of digestive organs: Secondary | ICD-10-CM

## 2019-07-26 DIAGNOSIS — Z13220 Encounter for screening for lipoid disorders: Secondary | ICD-10-CM | POA: Diagnosis not present

## 2019-07-26 DIAGNOSIS — Z23 Encounter for immunization: Secondary | ICD-10-CM

## 2019-07-26 DIAGNOSIS — Z1329 Encounter for screening for other suspected endocrine disorder: Secondary | ICD-10-CM | POA: Diagnosis not present

## 2019-07-26 DIAGNOSIS — Z130 Encounter for screening for diseases of the blood and blood-forming organs and certain disorders involving the immune mechanism: Secondary | ICD-10-CM

## 2019-07-26 DIAGNOSIS — Z0000 Encounter for general adult medical examination without abnormal findings: Secondary | ICD-10-CM

## 2019-07-26 DIAGNOSIS — E663 Overweight: Secondary | ICD-10-CM | POA: Diagnosis not present

## 2019-07-26 DIAGNOSIS — E559 Vitamin D deficiency, unspecified: Secondary | ICD-10-CM

## 2019-07-26 DIAGNOSIS — Z1231 Encounter for screening mammogram for malignant neoplasm of breast: Secondary | ICD-10-CM

## 2019-07-26 NOTE — Assessment & Plan Note (Signed)
Physical exam completed.  She will try to add in some aerobic exercise.  She will continue to eat healthy meals.  Discussed the importance of contacting us if she develops vaginal bleeding.  Mammogram ordered and she will call to schedule.  Discussed the importance of colonoscopy given her brother's history of colon cancer.  Referral to GI placed.  Discussed Shingrix and she will get this at her pharmacy.  Lab work as outlined below.

## 2019-07-26 NOTE — Progress Notes (Signed)
Tommi Rumps, MD Phone: [phone]  Jill Arellano is a 68 y.o. female who presents today for CPE.  Exercise is hit and miss.  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 Diet: She is eating healthy choice and lean cuisine fairly frequently.  She has cut out restaurant food. No history of abnormal Pap smears.  She consistently got Pap smears until age 56.  No vaginal bleeding. Mammogram is due. She is due for colonoscopy.  She does report a family history of colon cancer in her brother. No family history of breast cancer or ovarian cancer. Up-to-date on pneumonia vaccine, tetanus vaccine, and flu vaccine.  Due for Shingrix. She quit smoking cigarettes in 1996.  She does complete CT screening for lung cancer.  Rare alcohol use.  No illicit drug use.   Does not see a dentist as she has dentures.  She does see an ophthalmologist.  Active Ambulatory Problems    Diagnosis Date Noted  . Routine general medical examination at a health care facility 11/22/2014  . Screening for breast cancer 02/10/2016  . Generalized headaches 02/10/2016  . Screening for osteoporosis 02/10/2016  . Coronary artery disease involving native heart without angina pectoris 06/17/2017  . False positive stress test   . Atherosclerosis of aorta ([place]) 11/27/2017  . Osteopenia 07/25/2018   Resolved Ambulatory Problems    Diagnosis Date Noted  . No Resolved Ambulatory Problems   Past Medical History:  Diagnosis Date  . Chicken pox   . Vitamin D deficiency     Family History  Problem Relation Age of Onset  . Heart disease Mother        CHF  . Heart failure Mother   . Cancer Sister        lung  . Heart failure Brother   . Colon cancer Brother 27       colon  . Heart disease Sister        stent placement  . Breast cancer Neg Hx     Social History   Socioeconomic History  . Marital status: Single    Spouse name: Not on file  . Number of children: Not on file  .  Years of education: Not on file  . Highest education level: Not on file  Occupational History  . Not on file  Social Needs  . Financial resource strain: Not hard at all  . Food insecurity    Worry: Never true    Inability: Not on file  . Transportation needs    Medical: No    Non-medical: No  Tobacco Use  . Smoking status: Former Smoker    Packs/day: 2.00    Years: 44.00    Pack years: 88.00    Types: Cigarettes    Quit date: 2008    Years since quitting: 12.7  . Smokeless tobacco: Former Systems developer    Quit date: 08/10/1995  Substance and Sexual Activity  . Alcohol use: Yes    Alcohol/week: 1.0 standard drinks    Types: 1 Standard drinks or equivalent per week    Comment: beer OCC  . Drug use: No  . Sexual activity: Never  Lifestyle  . Physical activity    Days per week: Not on file    Minutes per session: Not on file  . Stress: Not at all  Relationships  . Social Herbalist on phone: Not on file    Gets together: Not on file  Attends religious service: Not on file    Active member of club or organization: Not on file    Attends meetings of clubs or organizations: Not on file    Relationship status: Not on file  . Intimate partner violence    Fear of current or ex partner: Not on file    Emotionally abused: Not on file    Physically abused: Not on file    Forced sexual activity: Not on file  Other Topics Concern  . 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 CV:  Negative for chest  pain, dyspnea, edema, palpitations Resp: Negative for cough, dyspnea, hemoptysis GI: Negative for nausea, vomiting, diarrhea, constipation, abdominal pain, melena, hematochezia. GU: Negative for dysuria, incontinence, urinary hesitance, hematuria, vaginal or penile discharge, polyuria, sexual difficulty, lumps in testicle or breasts MSK: Negative for muscle cramps or aches, joint pain or swelling Neuro: Negative for headaches, weakness, numbness, dizziness, passing out/fainting Psych: Negative for depression, anxiety, memory problems  Objective  Physical Exam Vitals:   07/26/19 1401  BP: 140/60  Pulse: 77  Temp: 98.3 F (36.8 C)  SpO2: 95%    BP Readings from Last 3 Encounters:  07/26/19 140/60  06/12/18 132/66  12/29/17 130/70   Wt Readings from Last 3 Encounters:  07/26/19 154 lb 3.2 oz (69.9 kg)  07/05/19 140 lb (63.5 kg)  06/12/18 138 lb 8 oz (62.8 kg)    Physical Exam Constitutional:      General: She is not in acute distress.    Appearance: She is not diaphoretic.  HENT:     Head: Normocephalic and atraumatic.  Eyes:     Conjunctiva/sclera: Conjunctivae normal.     Pupils: Pupils are equal, round, and reactive to light.  Cardiovascular:     Rate and Rhythm: Normal rate and regular rhythm.     Heart sounds: Normal heart sounds.  Pulmonary:     Effort: Pulmonary effort is normal.     Breath sounds: Normal breath sounds.  Abdominal:     General: Bowel sounds are normal. There is no distension.     Palpations: Abdomen is soft.     Tenderness: There is no abdominal tenderness. There is no guarding or rebound.  Genitourinary:    Comments: Chaperone used, bilateral breast with no skin changes, nipple inversion, masses, or tenderness, no axillary masses bilaterally Musculoskeletal:     Right lower leg: No edema.     Left lower leg: No edema.  Lymphadenopathy:     Cervical: No cervical adenopathy.  Skin:    General: Skin is warm and dry.  Neurological:      Mental Status: She is alert.  Psychiatric:        Mood and Affect: Mood normal.      Assessment/Plan:   Routine general medical examination at a health care facility Physical exam completed.  She will try to add in some aerobic exercise.  She will continue to eat healthy meals.  Discussed the importance of contacting us if she develops vaginal bleeding.  Mammogram ordered and she will call to schedule.  Discussed the importance of colonoscopy given her brother's history of colon cancer.  Referral to GI placed.  Discussed Shingrix and she will get this at her pharmacy.  Lab work as outlined below.   Orders Placed This Encounter  Procedures  . MM 3D SCREEN BREAST BILATERAL    Standing Status:   Future    Standing Expiration Date:   09/24/2020    Order Specific Question:   Reason for Exam (SYMPTOM  OR DIAGNOSIS REQUIRED)    Answer:   breast cancer screening    Order Specific Question:   Preferred imaging location?    Answer:   [facility]  . Vitamin D (25 hydroxy)  . TSH  . Lipid panel  . HgB A1c  . Comp Met (CMET)  . CBC  . Ambulatory referral to Gastroenterology    Referral Priority:   Routine    Referral Type:   Consultation    Referral Reason:   Specialty Services Required    Number of Visits Requested:   1    No orders of the defined types were placed in this encounter.    Tommi Rumps, MD [place]

## 2019-07-26 NOTE — Patient Instructions (Signed)
Nice to see you. Please try to add in some activity on top of your job.  Please continue to eat healthfully.  We will get you set up for a colonoscopy and mammogram.  Please call to schedule your mammogram.  Please check with the pharmacy regarding the shingles vaccine.

## 2019-07-27 DIAGNOSIS — Z23 Encounter for immunization: Secondary | ICD-10-CM

## 2019-07-27 LAB — LDL CHOLESTEROL, DIRECT: Direct LDL: 113 mg/dL

## 2019-07-27 LAB — COMPREHENSIVE METABOLIC PANEL
ALT: 13 U/L (ref 0–35)
AST: 14 U/L (ref 0–37)
Albumin: 4.5 g/dL (ref 3.5–5.2)
Alkaline Phosphatase: 58 U/L (ref 39–117)
BUN: 11 mg/dL (ref 6–23)
CO2: 28 mEq/L (ref 19–32)
Calcium: 9.8 mg/dL (ref 8.4–10.5)
Chloride: 102 mEq/L (ref 96–112)
Creatinine, Ser: 0.7 mg/dL (ref 0.40–1.20)
GFR: 82.72 mL/min (ref >60.00)
Glucose, Bld: 132 mg/dL — ABNORMAL HIGH (ref 70–99)
Potassium: 3.8 mEq/L (ref 3.5–5.1)
Sodium: 139 mEq/L (ref 135–145)
Total Bilirubin: 0.3 mg/dL (ref 0.2–1.2)
Total Protein: 6.7 g/dL (ref 6.0–8.3)

## 2019-07-27 LAB — CBC
HCT: 39.7 % (ref 36.0–46.0)
Hemoglobin: 13.7 g/dL (ref 12.0–15.0)
MCHC: 34.5 g/dL (ref 30.0–36.0)
MCV: 90.6 fl (ref 78.0–100.0)
Platelets: 255 10*3/uL (ref 150.0–400.0)
RBC: 4.38 Mil/uL (ref 3.87–5.11)
RDW: 12.1 % (ref 11.5–15.5)
WBC: 6.4 10*3/uL (ref 4.0–10.5)

## 2019-07-27 LAB — LIPID PANEL
Cholesterol: 239 mg/dL — ABNORMAL HIGH (ref 0–200)
HDL: 82.9 mg/dL (ref >39.00)
NonHDL: 156.41
Total CHOL/HDL Ratio: 3
Triglycerides: 305 mg/dL — ABNORMAL HIGH (ref 0.0–149.0)
VLDL: 61 mg/dL — ABNORMAL HIGH (ref 0.0–40.0)

## 2019-07-27 LAB — TSH: TSH: 1.36 u[IU]/mL (ref 0.35–4.50)

## 2019-07-27 LAB — HEMOGLOBIN A1C: Hgb A1c MFr Bld: 5.4 % (ref 4.6–6.5)

## 2019-07-27 LAB — VITAMIN D 25 HYDROXY (VIT D DEFICIENCY, FRACTURES): VITD: 22.19 ng/mL — ABNORMAL LOW (ref 30.00–100.00)

## 2019-07-27 NOTE — Progress Notes (Signed)
Done.  Sorry  about that.  Jill Arellano,cma

## 2019-07-29 ENCOUNTER — Other Ambulatory Visit: Payer: Self-pay

## 2019-07-29 ENCOUNTER — Telehealth: Payer: Self-pay | Admitting: Gastroenterology

## 2019-07-29 DIAGNOSIS — Z1211 Encounter for screening for malignant neoplasm of colon: Secondary | ICD-10-CM

## 2019-07-29 DIAGNOSIS — Z800 Family history of malignant neoplasm of digestive organs: Secondary | ICD-10-CM

## 2019-07-29 MED ORDER — NA SULFATE-K SULFATE-MG SULF 17.5-3.13-1.6 GM/177ML PO SOLN: 1.0000 | Freq: Once | ORAL | 0 refills | Status: AC

## 2019-07-29 NOTE — Telephone Encounter (Signed)
Gastroenterology Pre-Procedure Review  Request Date: Monday 08/09/19 Requesting Physician: Dr. Bonna Gains  PATIENT REVIEW QUESTIONS: The patient responded to the following health history questions as indicated:    1. Are you having any GI issues? no 2. Do you have a personal history of Polyps? no 3. Do you have a family history of Colon Cancer or Polyps? yes (brother colon cancer) 4. Diabetes Mellitus? no 5. Joint replacements in the past 12 months?no 6. Major health problems in the past 3 months?no 7. Any artificial heart valves, MVP, or defibrillator?no    MEDICATIONS & ALLERGIES:    Patient reports the following regarding taking any anticoagulation/antiplatelet therapy:   Plavix, Coumadin, Eliquis, Xarelto, Lovenox, Pradaxa, Brilinta, or Effient? no Aspirin? no  Patient confirms/reports the following medications:  Current Outpatient Medications  Medication Sig Dispense Refill  . cholecalciferol (EQL VITAMIN D-3) 400 units TABS tablet Take 800 Units by mouth daily.      No current facility-administered medications for this visit.     Patient confirms/reports the following allergies:  No Known Allergies  No orders of the defined types were placed in this encounter.   AUTHORIZATION INFORMATION Primary Insurance: 1D#: Group #:  Secondary Insurance: 1D#: Group #:  SCHEDULE INFORMATION: Date: 08/09/19 Time: Location:[facility]

## 2019-07-29 NOTE — Telephone Encounter (Signed)
Returned patients call to schedule colonoscopy.  LVM for her to call me back to schedule at the main number.  Thanks [employer]

## 2019-07-29 NOTE — Telephone Encounter (Signed)
Pt is calling to schedule a colonoscopy from a referral from PCP

## 2019-07-29 NOTE — Telephone Encounter (Signed)
Patient called stating she is returning a call to schedule a colonoscopy.

## 2019-08-05 ENCOUNTER — Other Ambulatory Visit
Admission: RE | Admit: 2019-08-05 | Discharge: 2019-08-05 | Disposition: A | Discharge status: Home / Self Care | Payer: Medicare PPO | Source: Ambulatory Visit | Attending: Gastroenterology | Admitting: Gastroenterology

## 2019-08-05 ENCOUNTER — Other Ambulatory Visit: Payer: Self-pay

## 2019-08-05 DIAGNOSIS — Z01812 Encounter for preprocedural laboratory examination: Secondary | ICD-10-CM | POA: Diagnosis present

## 2019-08-05 DIAGNOSIS — Z20828 Contact with and (suspected) exposure to other viral communicable diseases: Secondary | ICD-10-CM | POA: Diagnosis not present

## 2019-08-05 LAB — SARS CORONAVIRUS 2 (TAT 6-24 HRS): SARS Coronavirus 2: NEGATIVE

## 2019-08-09 ENCOUNTER — Encounter
Admission: RE | Disposition: A | Discharge status: Home / Self Care | Payer: Self-pay | Source: Home / Self Care | Attending: Gastroenterology

## 2019-08-09 ENCOUNTER — Ambulatory Visit: Payer: Medicare PPO | Admitting: Anesthesiology

## 2019-08-09 ENCOUNTER — Ambulatory Visit
Admission: RE | Admit: 2019-08-09 | Discharge: 2019-08-09 | Disposition: A | Discharge status: Home / Self Care | Payer: Medicare PPO | Attending: Gastroenterology | Admitting: Gastroenterology

## 2019-08-09 ENCOUNTER — Other Ambulatory Visit: Payer: Self-pay

## 2019-08-09 DIAGNOSIS — K635 Polyp of colon: Secondary | ICD-10-CM | POA: Diagnosis not present

## 2019-08-09 DIAGNOSIS — Z87891 Personal history of nicotine dependence: Secondary | ICD-10-CM | POA: Insufficient documentation

## 2019-08-09 DIAGNOSIS — K5730 Diverticulosis of large intestine without perforation or abscess without bleeding: Secondary | ICD-10-CM | POA: Diagnosis not present

## 2019-08-09 DIAGNOSIS — Z800 Family history of malignant neoplasm of digestive organs: Secondary | ICD-10-CM | POA: Insufficient documentation

## 2019-08-09 DIAGNOSIS — E559 Vitamin D deficiency, unspecified: Secondary | ICD-10-CM | POA: Diagnosis not present

## 2019-08-09 DIAGNOSIS — Z1211 Encounter for screening for malignant neoplasm of colon: Secondary | ICD-10-CM

## 2019-08-09 HISTORY — PX: COLONOSCOPY WITH PROPOFOL: SHX5780

## 2019-08-09 SURGERY — COLONOSCOPY WITH PROPOFOL
Anesthesia: General

## 2019-08-09 MED ORDER — PROPOFOL 500 MG/50ML IV EMUL
INTRAVENOUS | Status: DC | PRN
  Administered 2019-08-09: 175 ug/kg/min via INTRAVENOUS

## 2019-08-09 MED ORDER — PROPOFOL 10 MG/ML IV BOLUS
INTRAVENOUS | Status: DC | PRN
  Administered 2019-08-09: 40 mg via INTRAVENOUS
  Administered 2019-08-09: 60 mg via INTRAVENOUS

## 2019-08-09 MED ORDER — LIDOCAINE HCL (CARDIAC) PF 100 MG/5ML IV SOSY
PREFILLED_SYRINGE | INTRAVENOUS | Status: DC | PRN
  Administered 2019-08-09: 50 mg via INTRAVENOUS

## 2019-08-09 MED ORDER — SODIUM CHLORIDE 0.9 % IV SOLN
INTRAVENOUS | Status: DC
  Administered 2019-08-09: 1000 mL via INTRAVENOUS

## 2019-08-09 NOTE — Op Note (Signed)
Jill Arellano Patient Name: Jill Arellano Procedure Date: 08/09/2019 9:52 AM MRN: [id number] Account #: [id number] Date of Birth: December 05, 1950 Admit Type: Outpatient Age: 68 Room: [facility] ENDO ROOM 3 Gender: Female Note Status: Finalized Procedure:            Colonoscopy Indications:          Screening in patient at increased risk: Family history                        of 1st-degree relative with colorectal cancer Providers:            Semaj Kham B. Maximino Greenland MD, MD Medicines:            Monitored Anesthesia Care Complications:        No immediate complications. Procedure:            Pre-Anesthesia Assessment:                       - ASA Grade Assessment: II - A patient with mild                        systemic disease.                       - Prior to the procedure, a History and Physical was                        performed, and patient medications, allergies and                        sensitivities were reviewed. The patient's tolerance of                        previous anesthesia was reviewed.                       - The risks and benefits of the procedure and the                        sedation options and risks were discussed with the                        patient. All questions were answered and informed                        consent was obtained.                       - Patient identification and proposed procedure were                        verified prior to the procedure by the physician, the                        nurse, the anesthesiologist, the anesthetist and the                        technician. The procedure was verified in the procedure                        room.  After obtaining informed consent, the colonoscope was                        passed under direct vision. Throughout the procedure,                        the patient's blood pressure, pulse, and oxygen                        saturations were  monitored continuously. The                        Colonoscope was introduced through the anus and                        advanced to the the cecum, identified by appendiceal                        orifice and ileocecal valve. The colonoscopy was                        performed with ease. The patient tolerated the                        procedure well. The quality of the bowel preparation                        was fair. Findings:      The perianal and digital rectal examinations were normal.      Two sessile polyps were found in the sigmoid colon. The polyps were 3 to       4 mm in size. These polyps were removed with a jumbo cold forceps.       Resection and retrieval were complete.      Multiple diverticula were found in the sigmoid colon.      The exam was otherwise without abnormality.      The rectum, sigmoid colon, descending colon, transverse colon, ascending       colon and cecum appeared normal.      The retroflexed view of the distal rectum and anal verge was normal and       showed no anal or rectal abnormalities. Impression:           - Preparation of the colon was fair.                       - Two 3 to 4 mm polyps in the sigmoid colon, removed                        with a jumbo cold forceps. Resected and retrieved.                       - Diverticulosis in the sigmoid colon.                       - The examination was otherwise normal.                       - The rectum, sigmoid colon, descending colon,  transverse colon, ascending colon and cecum are normal.                       - The distal rectum and anal verge are normal on                        retroflexion view. Recommendation:       - Discharge patient to home (with escort).                       - High fiber diet.                       - Advance diet as tolerated.                       - Continue present medications.                       - Await pathology results.                        - Repeat colonoscopy in 2 years, with 2 day prep.                       - The findings and recommendations were discussed with                        the patient.                       - The findings and recommendations were discussed with                        the patient's family.                       - Return to primary care physician as previously                        scheduled. Procedure Code(s):    --- Professional ---                       [phone], Colonoscopy, flexible; with biopsy, single or                        multiple Diagnosis Code(s):    --- Professional ---                       Z80.0, Family history of malignant neoplasm of                        digestive organs                       K63.5, Polyp of colon                       K57.30, Diverticulosis of large intestine without                        perforation or abscess without bleeding CPT copyright 2019 American Medical Association. All rights reserved. The codes documented in  this report are preliminary and upon coder review may  be revised to meet current compliance requirements.  Melodie Bouillon, MD Michel Bickers B. Maximino Greenland MD, MD 08/09/2019 10:54:47 AM This report has been signed electronically. Number of Addenda: 0 Note Initiated On: 08/09/2019 9:52 AM Scope Withdrawal Time: 0 hours 20 minutes 20 seconds  Total Procedure Duration: 0 hours 26 minutes 54 seconds  Estimated Blood Loss: Estimated blood loss: none.      [facility]

## 2019-08-09 NOTE — Anesthesia Post-op Follow-up Note (Signed)
Anesthesia QCDR form completed.        

## 2019-08-09 NOTE — H&P (Signed)
Vonda Antigua, MD [address] [place], [place], [place], [address] [address], [place], [place], [place], [address] Phone: [phone]  Fax: [phone]  Primary Care Physician:  Burnard Hawthorne, FNP   Pre-Procedure History & Physical: HPI:  Jill Arellano is a 68 y.o. female is here for a colonoscopy.   Past Medical History:  Diagnosis Date  . Chicken pox   . Vitamin D deficiency     Past Surgical History:  Procedure Laterality Date  . LEFT HEART CATH AND CORONARY ANGIOGRAPHY N/A 08/28/2017   Procedure: LEFT HEART CATH AND CORONARY ANGIOGRAPHY;  Surgeon: Lorretta Harp, MD;  Location: [facility];  Service: Cardiovascular;  Laterality: N/A;  . TUBAL LIGATION      Prior to Admission medications   Medication Sig Start Date End Date Taking? Authorizing Provider  cholecalciferol (EQL VITAMIN D-3) 400 units TABS tablet Take 800 Units by mouth daily.    Yes [provider]    Allergies as of 07/29/2019  . (No Known Allergies)    Family History  Problem Relation Age of Onset  . Heart disease Mother        CHF  . Heart failure Mother   . Cancer Sister        lung  . Heart failure Brother   . Colon cancer Brother 2       colon  . Heart disease Sister        stent placement  . Breast cancer Neg Hx     Social History   Socioeconomic History  . Marital status: Single    Spouse name: Not on file  . Number of children: Not on file  . Years of education: Not on file  . Highest education level: Not on file  Occupational History  . Not on file  Social Needs  . Financial resource strain: Not hard at all  . Food insecurity    Worry: Never true    Inability: Not on file  . Transportation needs    Medical: No    Non-medical: No  Tobacco Use  . Smoking status: Former Smoker    Packs/day: 2.00    Years: 44.00    Pack years: 88.00    Types: Cigarettes    Quit date: 2008    Years since quitting: 12.8  . Smokeless tobacco:  Former Systems developer    Quit date: 08/10/1995  Substance and Sexual Activity  . Alcohol use: Yes    Alcohol/week: 1.0 standard drinks    Types: 1 Standard drinks or equivalent per week    Comment: beer OCC  . Drug use: No  . Sexual activity: Never  Lifestyle  . Physical activity    Days per week: Not on file    Minutes per session: Not on file  . Stress: Not at all  Relationships  . Social Herbalist on phone: Not on file    Gets together: Not on file    Attends religious service: Not on file    Active member of club or organization: Not on file    Attends meetings of clubs or organizations: Not on file    Relationship status: Not on file  . Intimate partner violence    Fear of current or ex partner: Not on file    Emotionally abused: Not on file    Physically abused: Not on file    Forced sexual activity: Not on file  Other Topics Concern  .  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 Neck:  Supple; no masses or thyromegaly. Lungs:  Clear throughout to auscultation, normal respiratory effort.    Heart:  +S1, +S2, Regular rate and rhythm, No edema. Abdomen:  Soft, nontender and nondistended. Normal bowel sounds, without guarding, and without rebound.   Neurologic:  Alert and  oriented x4;  grossly normal neurologically.  Impression/Plan: Jill Arellano is here for a colonoscopy to be performed for family history of colon cancer  Risks, benefits,  limitations, and alternatives regarding  colonoscopy have been reviewed with the patient.  Questions have been answered.  All parties agreeable.   [name], MD  08/09/2019, 10:12 AM

## 2019-08-09 NOTE — Anesthesia Procedure Notes (Signed)
Date/Time: 08/09/2019 10:15 AM Performed by: Johnna Acosta, CRNA Pre-anesthesia Checklist: Patient identified, Emergency Drugs available, Suction available, Patient being monitored and Timeout performed Patient Re-evaluated:Patient Re-evaluated prior to induction Oxygen Delivery Method: Nasal cannula Preoxygenation: Pre-oxygenation with 100% oxygen Induction Type: IV induction

## 2019-08-09 NOTE — Anesthesia Preprocedure Evaluation (Signed)
Anesthesia Evaluation  Patient identified by MRN, date of birth, ID band Patient awake    Reviewed: Allergy & Precautions, NPO status , Patient's Chart, lab work & pertinent test results  History of Anesthesia Complications Negative for: history of anesthetic complications  Airway Mallampati: II  TM Distance: >3 FB Neck ROM: Full    Dental  (+) Poor Dentition, Upper Dentures   Pulmonary neg pulmonary ROS, neg sleep apnea, neg COPD, former smoker,    breath sounds clear to auscultation- rhonchi (-) wheezing      Cardiovascular Exercise Tolerance: Good (-) hypertension(-) CAD, (-) Past MI, (-) Cardiac Stents and (-) CABG  Rhythm:Regular Rate:Normal - Systolic murmurs and - Diastolic murmurs    Neuro/Psych  Headaches, neg Seizures negative psych ROS   GI/Hepatic negative GI ROS, Neg liver ROS,   Endo/Other  negative endocrine ROSneg diabetes  Renal/GU negative Renal ROS     Musculoskeletal negative musculoskeletal ROS (+)   Abdominal (+) - obese,   Peds  Hematology negative hematology ROS (+)   Anesthesia Other Findings Past Medical History: No date: Chicken pox No date: Vitamin D deficiency   Reproductive/Obstetrics                             Anesthesia Physical Anesthesia Plan  ASA: II  Anesthesia Plan: General   Post-op Pain Management:    Induction: Intravenous  PONV Risk Score and Plan: 2 and Propofol infusion  Airway Management Planned: Natural Airway  Additional Equipment:   Intra-op Plan:   Post-operative Plan:   Informed Consent: I have reviewed the patients History and Physical, chart, labs and discussed the procedure including the risks, benefits and alternatives for the proposed anesthesia with the patient or authorized representative who has indicated his/her understanding and acceptance.     Dental advisory given  Plan Discussed with: CRNA and  Anesthesiologist  Anesthesia Plan Comments:         Anesthesia Quick Evaluation

## 2019-08-09 NOTE — Anesthesia Postprocedure Evaluation (Signed)
Anesthesia Post Note  Patient: Cedric Denison Niu  Procedure(s) Performed: COLONOSCOPY WITH PROPOFOL (N/A )  Patient location during evaluation: Endoscopy Anesthesia Type: General Level of consciousness: awake and alert and oriented Pain management: pain level controlled Vital Signs Assessment: post-procedure vital signs reviewed and stable Respiratory status: spontaneous breathing, nonlabored ventilation and respiratory function stable Cardiovascular status: blood pressure returned to baseline and stable Postop Assessment: no signs of nausea or vomiting Anesthetic complications: no     Last Vitals:  Vitals:   08/09/19 0933 08/09/19 1052  BP: (!) 154/58 (!) 120/54  Pulse: 64 83  Resp: 20 18  Temp: (!) 36 C (!) 36.2 C  SpO2: 99% 98%    Last Pain:  Vitals:   08/09/19 1052  TempSrc: Tympanic  PainSc: 0-No pain                 Daniela Hernan

## 2019-08-09 NOTE — Transfer of Care (Signed)
Immediate Anesthesia Transfer of Care Note  Patient: Jill Arellano  Procedure(s) Performed: COLONOSCOPY WITH PROPOFOL (N/A )  Patient Location: PACU  Anesthesia Type:General  Level of Consciousness: awake, alert  and oriented  Airway & Oxygen Therapy: Patient Spontanous Breathing  Post-op Assessment: Report given to RN and Post -op Vital signs reviewed and stable  Post vital signs: Reviewed and stable  Last Vitals:  Vitals Value Taken Time  BP 120/54 08/09/19 1052  Temp 36.2 C 08/09/19 1052  Pulse 83 08/09/19 1052  Resp 18 08/09/19 1052  SpO2 98 % 08/09/19 1052    Last Pain:  Vitals:   08/09/19 1052  TempSrc: Tympanic  PainSc: 0-No pain         Complications: No apparent anesthesia complications

## 2019-08-10 ENCOUNTER — Encounter: Payer: Self-pay | Admitting: Gastroenterology

## 2019-08-10 LAB — SURGICAL PATHOLOGY

## 2019-08-18 ENCOUNTER — Ambulatory Visit
Admission: RE | Admit: 2019-08-18 | Discharge: 2019-08-18 | Disposition: A | Discharge status: Home / Self Care | Payer: Medicare PPO | Source: Ambulatory Visit | Attending: Family Medicine | Admitting: Family Medicine

## 2019-08-18 DIAGNOSIS — Z1231 Encounter for screening mammogram for malignant neoplasm of breast: Secondary | ICD-10-CM | POA: Diagnosis not present

## 2020-01-24 ENCOUNTER — Encounter: Payer: Medicare PPO | Admitting: Family

## 2020-01-31 ENCOUNTER — Encounter: Payer: Self-pay | Admitting: Family

## 2020-01-31 ENCOUNTER — Ambulatory Visit (INDEPENDENT_AMBULATORY_CARE_PROVIDER_SITE_OTHER): Payer: Medicare HMO | Admitting: Family

## 2020-01-31 ENCOUNTER — Other Ambulatory Visit: Payer: Self-pay

## 2020-01-31 VITALS — BP 122/62 | HR 71 | Temp 96.2°F | Ht 61.0 in | Wt 148.0 lb

## 2020-01-31 DIAGNOSIS — Z0000 Encounter for general adult medical examination without abnormal findings: Secondary | ICD-10-CM

## 2020-01-31 NOTE — Patient Instructions (Addendum)
For post menopausal women, guidelines recommend a diet with 1200 mg of Calcium per day. If you are eating calcium rich foods, you do not need a calcium supplement. The body better absorbs the calcium that you eat over supplementation. If you do supplement, I recommend not supplementing the full 1200 mg/ day as this can lead to increased risk of cardiovascular disease. I recommend Calcium Citrate over the counter, and you may take a total of 600 to 800 mg per day in divided doses with meals for best absorption.   For bone health, you need adequate vitamin D, and I recommend you supplement as it is harder to do so with diet alone. I recommend cholecalciferol 800 units daily.  Also, please ensure you are following a diet high in calcium -- research shows better outcomes with dietary sources including kale, yogurt, broccolii, cheese, okra, almonds- to name a few.     Also remember that exercise is a great medicine for maintain and preserve bone health. Advise moderate exercise for 30 minutes , 3 times per week.    Health Maintenance for Postmenopausal Women Menopause is a normal process in which your ability to get pregnant comes to an end. This process happens slowly over many months or years, usually between the ages of 56 and 89. Menopause is complete when you have missed your menstrual periods for 12 months. It is important to talk with your health care provider about some of the most common conditions that affect women after menopause (postmenopausal women). These include heart disease, cancer, and bone loss (osteoporosis). Adopting a healthy lifestyle and getting preventive care can help to promote your health and wellness. The actions you take can also lower your chances of developing some of these common conditions. What should I know about menopause? During menopause, you may get a number of symptoms, such as:  Hot flashes. These can be moderate or severe.  Night sweats.  Decrease in sex  drive.  Mood swings.  Headaches.  Tiredness.  Irritability.  Memory problems.  Insomnia. Choosing to treat or not to treat these symptoms is a decision that you make with your health care provider. Do I need hormone replacement therapy?  Hormone replacement therapy is effective in treating symptoms that are caused by menopause, such as hot flashes and night sweats.  Hormone replacement carries certain risks, especially as you become older. If you are thinking about using estrogen or estrogen with progestin, discuss the benefits and risks with your health care provider. What is my risk for heart disease and stroke? The risk of heart disease, heart attack, and stroke increases as you age. One of the causes may be a change in the body's hormones during menopause. This can affect how your body uses dietary fats, triglycerides, and cholesterol. Heart attack and stroke are medical emergencies. There are many things that you can do to help prevent heart disease and stroke. Watch your blood pressure  High blood pressure causes heart disease and increases the risk of stroke. This is more likely to develop in people who have high blood pressure readings, are of African descent, or are overweight.  Have your blood pressure checked: ? Every 3-5 years if you are 37-55 years of age. ? Every year if you are 18 years old or older. Eat a healthy diet   Eat a diet that includes plenty of vegetables, fruits, low-fat dairy products, and lean protein.  Do not eat a lot of foods that are high in solid  fats, added sugars, or sodium. Get regular exercise Get regular exercise. This is one of the most important things you can do for your health. Most adults should:  Try to exercise for at least 150 minutes each week. The exercise should increase your heart rate and make you sweat (moderate-intensity exercise).  Try to do strengthening exercises at least twice each week. Do these in addition to the  moderate-intensity exercise.  Spend less time sitting. Even light physical activity can be beneficial. Other tips  Work with your health care provider to achieve or maintain a healthy weight.  Do not use any products that contain nicotine or tobacco, such as cigarettes, e-cigarettes, and chewing tobacco. If you need help quitting, ask your health care provider.  Know your numbers. Ask your health care provider to check your cholesterol and your blood sugar (glucose). Continue to have your blood tested as directed by your health care provider. Do I need screening for cancer? Depending on your health history and family history, you may need to have cancer screening at different stages of your life. This may include screening for:  Breast cancer.  Cervical cancer.  Lung cancer.  Colorectal cancer. What is my risk for osteoporosis? After menopause, you may be at increased risk for osteoporosis. Osteoporosis is a condition in which bone destruction happens more quickly than new bone creation. To help prevent osteoporosis or the bone fractures that can happen because of osteoporosis, you may take the following actions:  If you are 26-54 years old, get at least 1,000 mg of calcium and at least 600 mg of vitamin D per day.  If you are older than age 31 but younger than age 38, get at least 1,200 mg of calcium and at least 600 mg of vitamin D per day.  If you are older than age 4, get at least 1,200 mg of calcium and at least 800 mg of vitamin D per day. Smoking and drinking excessive alcohol increase the risk of osteoporosis. Eat foods that are rich in calcium and vitamin D, and do weight-bearing exercises several times each week as directed by your health care provider. How does menopause affect my mental health? Depression may occur at any age, but it is more common as you become older. Common symptoms of depression include:  Low or sad mood.  Changes in sleep patterns.  Changes in  appetite or eating patterns.  Feeling an overall lack of motivation or enjoyment of activities that you previously enjoyed.  Frequent crying spells. Talk with your health care provider if you think that you are experiencing depression. General instructions See your health care provider for regular wellness exams and vaccines. This may include:  Scheduling regular health, dental, and eye exams.  Getting and maintaining your vaccines. These include: ? Influenza vaccine. Get this vaccine each year before the flu season begins. ? Pneumonia vaccine. ? Shingles vaccine. ? Tetanus, diphtheria, and pertussis (Tdap) booster vaccine. Your health care provider may also recommend other immunizations. Tell your health care provider if you have ever been abused or do not feel safe at home. Summary  Menopause is a normal process in which your ability to get pregnant comes to an end.  This condition causes hot flashes, night sweats, decreased interest in sex, mood swings, headaches, or lack of sleep.  Treatment for this condition may include hormone replacement therapy.  Take actions to keep yourself healthy, including exercising regularly, eating a healthy diet, watching your weight, and checking your blood  pressure and blood sugar levels.  Get screened for cancer and depression. Make sure that you are up to date with all your vaccines. This information is not intended to replace advice given to you by your health care provider. Make sure you discuss any questions you have with your health care provider. Document Revised: 09/23/2018 Document Reviewed: 09/23/2018 Elsevier Patient Education  2020 [employer].

## 2020-01-31 NOTE — Progress Notes (Signed)
Subjective:    Patient ID: Jill Arellano, female    DOB: [birth year], 69 y.o.   MRN: [id number]  CC: Jill Arellano is a 69 y.o. female who presents today for physical exam.    HPI: Feels well today, no complaints Has been taking vit D 10,000 units/day     Colorectal Cancer Screening: Negative Cologuard 2 years ago; colonoscopy 07/2019; repeat in 2 years.  Breast Cancer Screening: Mammogram UTD Cervical Cancer Screening:no longer screening. No h/o GYN. No vaginal pain, vaginal bleeding.  Bone Health screening/DEXA for 65+: due 07/2020 Lung Cancer Screening: Doesn't have 30 year pack year history and age > 55 years.       Tetanus -UTD Labs: Screening labs done 07/2019, however not fasting, would like glucose, cholesterol rechecked.  Exercise: Gets regular exercise.  Alcohol use: occasional Smoking/tobacco use: former smoker.   HISTORY:  Past Medical History:  Diagnosis Date  . Chicken pox   . Vitamin D deficiency     Past Surgical History:  Procedure Laterality Date  . CATARACT EXTRACTION, BILATERAL  07/2019  . COLONOSCOPY WITH PROPOFOL N/A 08/09/2019   Procedure: COLONOSCOPY WITH PROPOFOL;  Surgeon: Pasty Spillers, MD;  Location: [facility] ENDOSCOPY;  Service: Endoscopy;  Laterality: N/A;  . LEFT HEART CATH AND CORONARY ANGIOGRAPHY N/A 08/28/2017   Procedure: LEFT HEART CATH AND CORONARY ANGIOGRAPHY;  Surgeon: Runell Gess, MD;  Location: [facility];  Service: Cardiovascular;  Laterality: N/A;  . TUBAL LIGATION     Family History  Problem Relation Age of Onset  . Heart disease Mother        CHF  . Heart failure Mother   . Cancer Sister        lung  . Heart failure Brother   . Colon cancer Brother 55       colon  . Heart disease Sister        stent placement  . Breast cancer Neg Hx       ALLERGIES: Patient has no known allergies.  Current Outpatient Medications on File Prior to Visit  Medication Sig Dispense Refill  . cholecalciferol  (EQL VITAMIN D-3) 400 units TABS tablet Take 800 Units by mouth daily.      No current facility-administered medications on file prior to visit.    Social History   Tobacco Use  . Smoking status: Former Smoker    Packs/day: 2.00    Years: 44.00    Pack years: 88.00    Types: Cigarettes    Quit date: 2008    Years since quitting: 13.3  . Smokeless tobacco: Former Neurosurgeon    Quit date: 08/10/1995  Substance Use Topics  . Alcohol use: Yes    Alcohol/week: 1.0 standard drinks    Types: 1 Standard drinks or equivalent per week    Comment: beer OCC  . Drug use: No    Review of Systems    Objective:    BP 122/62   Pulse 71   Temp (!) 96.2 F (35.7 C) (Temporal)   Ht 5\' 1"  (1.549 m)   Wt 148 lb (67.1 kg)   SpO2 96%   BMI 27.96 kg/m   BP Readings from Last 3 Encounters:  01/31/20 122/62  08/09/19 (!) 151/59  07/26/19 140/60   Wt Readings from Last 3 Encounters:  01/31/20 148 lb (67.1 kg)  07/26/19 154 lb 3.2 oz (69.9 kg)  07/05/19 140 lb (63.5 kg)    Physical Exam Vitals reviewed.  Constitutional:  Appearance: She is well-developed.  Eyes:     Conjunctiva/sclera: Conjunctivae normal.  Neck:     Thyroid: No thyroid mass or thyromegaly.  Cardiovascular:     Rate and Rhythm: Normal rate and regular rhythm.     Pulses: Normal pulses.     Heart sounds: Normal heart sounds.  Pulmonary:     Effort: Pulmonary effort is normal.     Breath sounds: Normal breath sounds. No wheezing, rhonchi or rales.  Chest:     Breasts:        Right: Normal. No swelling, bleeding, inverted nipple, mass, nipple discharge, skin change or tenderness.        Left: Normal. No swelling, bleeding, inverted nipple, mass, nipple discharge, skin change or tenderness.  Lymphadenopathy:     Head:     Right side of head: No submental, submandibular, tonsillar, preauricular, posterior auricular or occipital adenopathy.     Left side of head: No submental, submandibular, tonsillar,  preauricular, posterior auricular or occipital adenopathy.     Cervical: No cervical adenopathy.  Skin:    General: Skin is warm and dry.  Neurological:     Mental Status: She is alert.  Psychiatric:        Speech: Speech normal.        Behavior: Behavior normal.        Thought Content: Thought content normal.        Assessment & Plan:   Problem List Items Addressed This Visit      Other   Routine general medical examination at a health care facility - Primary    Clinical breast exam performed today.  Deferred pelvic exam in the absence of symptoms and patient preference. colonoscopy is up-to-date.  Repeat cholesterol, glucose.  Patient will call later in the year so we can order bone density      Relevant Orders   Comprehensive metabolic panel   Lipid panel   VITAMIN D 25 Hydroxy (Vit-D Deficiency, Fractures)       I am having Larry Sierras. Babler maintain her cholecalciferol.   No orders of the defined types were placed in this encounter.   Return precautions given.   Risks, benefits, and alternatives of the medications and treatment plan prescribed today were discussed, and patient expressed understanding.   Education regarding symptom management and diagnosis given to patient on AVS.   Continue to follow with Burnard Hawthorne, FNP for routine health maintenance.   Malachy Mood and I agreed with plan.   Mable Paris, FNP

## 2020-01-31 NOTE — Assessment & Plan Note (Signed)
Clinical breast exam performed today.  Deferred pelvic exam in the absence of symptoms and patient preference. colonoscopy is up-to-date.  Repeat cholesterol, glucose.  Patient will call later in the year so we can order bone density

## 2020-02-07 ENCOUNTER — Other Ambulatory Visit: Payer: Self-pay

## 2020-02-07 ENCOUNTER — Other Ambulatory Visit (INDEPENDENT_AMBULATORY_CARE_PROVIDER_SITE_OTHER): Payer: Medicare HMO

## 2020-02-07 DIAGNOSIS — Z0000 Encounter for general adult medical examination without abnormal findings: Secondary | ICD-10-CM | POA: Diagnosis not present

## 2020-02-07 LAB — LIPID PANEL
Cholesterol: 248 mg/dL — ABNORMAL HIGH (ref 0–200)
HDL: 100.8 mg/dL
LDL Cholesterol: 135 mg/dL — ABNORMAL HIGH (ref 0–99)
NonHDL: 147.26
Total CHOL/HDL Ratio: 2
Triglycerides: 61 mg/dL (ref 0.0–149.0)
VLDL: 12.2 mg/dL (ref 0.0–40.0)

## 2020-02-07 LAB — COMPREHENSIVE METABOLIC PANEL WITH GFR
ALT: 15 U/L (ref 0–35)
AST: 16 U/L (ref 0–37)
Albumin: 4.5 g/dL (ref 3.5–5.2)
Alkaline Phosphatase: 49 U/L (ref 39–117)
BUN: 17 mg/dL (ref 6–23)
CO2: 24 meq/L (ref 19–32)
Calcium: 9.3 mg/dL (ref 8.4–10.5)
Chloride: 102 meq/L (ref 96–112)
Creatinine, Ser: 0.65 mg/dL (ref 0.40–1.20)
GFR: 89.97 mL/min
Glucose, Bld: 89 mg/dL (ref 70–99)
Potassium: 3.9 meq/L (ref 3.5–5.1)
Sodium: 135 meq/L (ref 135–145)
Total Bilirubin: 0.5 mg/dL (ref 0.2–1.2)
Total Protein: 7.1 g/dL (ref 6.0–8.3)

## 2020-02-07 LAB — VITAMIN D 25 HYDROXY (VIT D DEFICIENCY, FRACTURES): VITD: 79.76 ng/mL (ref 30.00–100.00)

## 2020-05-16 ENCOUNTER — Ambulatory Visit: Payer: Medicare HMO

## 2020-06-10 ENCOUNTER — Telehealth: Payer: Self-pay | Admitting: *Deleted

## 2020-06-10 NOTE — Telephone Encounter (Signed)
Left a voicemail to inform the patent that is time for her lung cancer screening scan. Instructed patient to call us back to update information so we can get her scheduled for her CT scan.

## 2020-06-24 ENCOUNTER — Telehealth: Payer: Self-pay

## 2020-06-24 DIAGNOSIS — Z87891 Personal history of nicotine dependence: Secondary | ICD-10-CM

## 2020-06-24 DIAGNOSIS — Z122 Encounter for screening for malignant neoplasm of respiratory organs: Secondary | ICD-10-CM

## 2020-06-24 NOTE — Telephone Encounter (Signed)
Patient notified that lung screening imaging is due currently or in the near future. Patient's preference is for a Monday, her day off.  Patient is a former smoke.

## 2020-06-27 NOTE — Addendum Note (Signed)
Addended by: Jonne Ply on: 06/27/2020 09:13 AM   Modules accepted: Orders

## 2020-06-27 NOTE — Telephone Encounter (Signed)
Contacted and scheduled. Former smoker, quit 2008, 88 pack year.

## 2020-06-30 DIAGNOSIS — H04123 Dry eye syndrome of bilateral lacrimal glands: Secondary | ICD-10-CM | POA: Diagnosis not present

## 2020-07-10 ENCOUNTER — Ambulatory Visit
Admission: RE | Admit: 2020-07-10 | Discharge: 2020-07-10 | Disposition: A | Discharge status: Home / Self Care | Payer: Medicare HMO | Source: Ambulatory Visit | Attending: Nurse Practitioner | Admitting: Nurse Practitioner

## 2020-07-10 ENCOUNTER — Other Ambulatory Visit: Payer: Self-pay

## 2020-07-10 DIAGNOSIS — Z87891 Personal history of nicotine dependence: Secondary | ICD-10-CM | POA: Diagnosis not present

## 2020-07-10 DIAGNOSIS — Z122 Encounter for screening for malignant neoplasm of respiratory organs: Secondary | ICD-10-CM | POA: Diagnosis not present

## 2020-07-11 ENCOUNTER — Telehealth: Payer: Self-pay | Admitting: Family

## 2020-07-11 NOTE — Telephone Encounter (Signed)
Mail letter  Jill Arellano,   [facility] you are doing well.   I received results from your annual CT lung scan from [facility] which showed stable lung nodules and emphysema.   You will need do this again next year so please ensure you are contacted in regards to this and certainly call our office if you are not contacted for another test.   It was noted again on the exam that you have coronary artery atherosclerosis, or often referred to as hardening of the arteries.   This can put you at risk for stroke, heart attack.   Again I would recommend that you a consider a cholesterol medication; however I know we have discussed this in the past and you had declined. Certainly let me know if you would like to reconsider.   My best,  Rennie Plowman, NP

## 2020-07-11 NOTE — Telephone Encounter (Signed)
Letter mailed to patient.

## 2020-07-17 ENCOUNTER — Ambulatory Visit (INDEPENDENT_AMBULATORY_CARE_PROVIDER_SITE_OTHER): Payer: Medicare HMO

## 2020-07-17 VITALS — BP 124/52 | Ht 61.0 in | Wt 124.6 lb

## 2020-07-17 DIAGNOSIS — Z0000 Encounter for general adult medical examination without abnormal findings: Secondary | ICD-10-CM | POA: Diagnosis not present

## 2020-07-17 NOTE — Patient Instructions (Addendum)
Jill Arellano , Thank you for taking time to come for your Medicare Wellness Visit. I appreciate your ongoing commitment to your health goals. Please review the following plan we discussed and let me know if I can assist you in the future.   These are the goals we discussed: Goals      Patient Stated     I would like to lower my cholesterol (pt-stated)      Stay active Healthy diet       This is a list of the screening recommended for you and due dates:  Health Maintenance  Topic Date Due   Tetanus Vaccine  01/27/2021   Colon Cancer Screening  08/08/2021   Mammogram  08/17/2021   Flu Shot  Completed   DEXA scan (bone density measurement)  Completed   COVID-19 Vaccine  Completed    Hepatitis C: One time screening is recommended by Center for Disease Control  (CDC) for  adults born from [birth year] through 1967.   Completed   Pneumonia vaccines  Completed    Immunizations Immunization History  Administered Date(s) Administered   Fluad Quad(high Dose 65+) 07/27/2019   Influenza Split 08/28/2010   Influenza-Unspecified 07/10/2020   PFIZER SARS-COV-2 Vaccination 11/26/2019, 12/24/2019   Pneumococcal Conjugate-13 12/27/2016   Pneumococcal Polysaccharide-23 11/22/2014   Tdap 01/28/2011   Zoster 02/14/2014   Keep all routine maintenance appointments.   Follow up 08/07/20 @ 2:00  Advanced directives: End of life planning; Advance aging; Advanced directives discussed.  Copy of current HCPOA/Living Will requested.    Conditions/risks identified: none new.   Follow up in one year for your annual wellness visit.   Preventive Care 75 Years and Older, Female Preventive care refers to lifestyle choices and visits with your health care provider that can promote health and wellness. What does preventive care include?  A yearly physical exam. This is also called an annual well check.  Dental exams once or twice a year.  Routine eye exams. Ask your health care provider  how often you should have your eyes checked.  Personal lifestyle choices, including:  Daily care of your teeth and gums.  Regular physical activity.  Eating a healthy diet.  Avoiding tobacco and drug use.  Limiting alcohol use.  Practicing safe sex.  Taking low-dose aspirin every day.  Taking vitamin and mineral supplements as recommended by your health care provider. What happens during an annual well check? The services and screenings done by your health care provider during your annual well check will depend on your age, overall health, lifestyle risk factors, and family history of disease. Counseling  Your health care provider may ask you questions about your:  Alcohol use.  Tobacco use.  Drug use.  Emotional well-being.  Home and relationship well-being.  Sexual activity.  Eating habits.  History of falls.  Memory and ability to understand (cognition).  Work and work Astronomer.  Reproductive health. Screening  You may have the following tests or measurements:  Height, weight, and BMI.  Blood pressure.  Lipid and cholesterol levels. These may be checked every 5 years, or more frequently if you are over 80 years old.  Skin check.  Lung cancer screening. You may have this screening every year starting at age 18 if you have a 30-pack-year history of smoking and currently smoke or have quit within the past 15 years.  Fecal occult blood test (FOBT) of the stool. You may have this test every year starting at age 64.  Flexible sigmoidoscopy  or colonoscopy. You may have a sigmoidoscopy every 5 years or a colonoscopy every 10 years starting at age 26.  Hepatitis C blood test.  Hepatitis B blood test.  Sexually transmitted disease (STD) testing.  Diabetes screening. This is done by checking your blood sugar (glucose) after you have not eaten for a while (fasting). You may have this done every 1-3 years.  Bone density scan. This is done to screen for  osteoporosis. You may have this done starting at age 28.  Mammogram. This may be done every 1-2 years. Talk to your health care provider about how often you should have regular mammograms. Talk with your health care provider about your test results, treatment options, and if necessary, the need for more tests. Vaccines  Your health care provider may recommend certain vaccines, such as:  Influenza vaccine. This is recommended every year.  Tetanus, diphtheria, and acellular pertussis (Tdap, Td) vaccine. You may need a Td booster every 10 years.  Zoster vaccine. You may need this after age 42.  Pneumococcal 13-valent conjugate (PCV13) vaccine. One dose is recommended after age 46.  Pneumococcal polysaccharide (PPSV23) vaccine. One dose is recommended after age 45. Talk to your health care provider about which screenings and vaccines you need and how often you need them. This information is not intended to replace advice given to you by your health care provider. Make sure you discuss any questions you have with your health care provider. Document Released: 10/27/2015 Document Revised: 06/19/2016 Document Reviewed: 08/01/2015 Elsevier Interactive Patient Education  2017 [employer].  Fall Prevention in the Home Falls can cause injuries. They can happen to people of all ages. There are many things you can do to make your home safe and to help prevent falls. What can I do on the outside of my home?  Regularly fix the edges of walkways and driveways and fix any cracks.  Remove anything that might make you trip as you walk through a door, such as a raised step or threshold.  Trim any bushes or trees on the path to your home.  Use bright outdoor lighting.  Clear any walking paths of anything that might make someone trip, such as rocks or tools.  Regularly check to see if handrails are loose or broken. Make sure that both sides of any steps have handrails.  Any raised decks and porches  should have guardrails on the edges.  Have any leaves, snow, or ice cleared regularly.  Use sand or salt on walking paths during winter.  Clean up any spills in your garage right away. This includes oil or grease spills. What can I do in the bathroom?  Use night lights.  Install grab bars by the toilet and in the tub and shower. Do not use towel bars as grab bars.  Use non-skid mats or decals in the tub or shower.  If you need to sit down in the shower, use a plastic, non-slip stool.  Keep the floor dry. Clean up any water that spills on the floor as soon as it happens.  Remove soap buildup in the tub or shower regularly.  Attach bath mats securely with double-sided non-slip rug tape.  Do not have throw rugs and other things on the floor that can make you trip. What can I do in the bedroom?  Use night lights.  Make sure that you have a light by your bed that is easy to reach.  Do not use any sheets or blankets that are  too big for your bed. They should not hang down onto the floor.  Have a firm chair that has side arms. You can use this for support while you get dressed.  Do not have throw rugs and other things on the floor that can make you trip. What can I do in the kitchen?  Clean up any spills right away.  Avoid walking on wet floors.  Keep items that you use a lot in easy-to-reach places.  If you need to reach something above you, use a strong step stool that has a grab bar.  Keep electrical cords out of the way.  Do not use floor polish or wax that makes floors slippery. If you must use wax, use non-skid floor wax.  Do not have throw rugs and other things on the floor that can make you trip. What can I do with my stairs?  Do not leave any items on the stairs.  Make sure that there are handrails on both sides of the stairs and use them. Fix handrails that are broken or loose. Make sure that handrails are as long as the stairways.  Check any carpeting to  make sure that it is firmly attached to the stairs. Fix any carpet that is loose or worn.  Avoid having throw rugs at the top or bottom of the stairs. If you do have throw rugs, attach them to the floor with carpet tape.  Make sure that you have a light switch at the top of the stairs and the bottom of the stairs. If you do not have them, ask someone to add them for you. What else can I do to help prevent falls?  Wear shoes that:  Do not have high heels.  Have rubber bottoms.  Are comfortable and fit you well.  Are closed at the toe. Do not wear sandals.  If you use a stepladder:  Make sure that it is fully opened. Do not climb a closed stepladder.  Make sure that both sides of the stepladder are locked into place.  Ask someone to hold it for you, if possible.  Clearly mark and make sure that you can see:  Any grab bars or handrails.  First and last steps.  Where the edge of each step is.  Use tools that help you move around (mobility aids) if they are needed. These include:  Canes.  Walkers.  Scooters.  Crutches.  Turn on the lights when you go into a dark area. Replace any light bulbs as soon as they burn out.  Set up your furniture so you have a clear path. Avoid moving your furniture around.  If any of your floors are uneven, fix them.  If there are any pets around you, be aware of where they are.  Review your medicines with your doctor. Some medicines can make you feel dizzy. This can increase your chance of falling. Ask your doctor what other things that you can do to help prevent falls. This information is not intended to replace advice given to you by your health care provider. Make sure you discuss any questions you have with your health care provider. Document Released: 07/27/2009 Document Revised: 03/07/2016 Document Reviewed: 11/04/2014 Elsevier Interactive Patient Education  2017 [employer].

## 2020-07-17 NOTE — Progress Notes (Addendum)
Subjective:   Jill Arellano is a 69 y.o. female who presents for Medicare Annual (Subsequent) preventive examination.  Review of Systems    No ROS.  Medicare Wellness Virtual Visit.    Cardiac Risk Factors include: advanced age (>31men, >44 women)     Objective:    Today's Vitals   07/17/20 0838  BP: (!) 124/52  Weight: 124 lb 9.6 oz (56.5 kg)  Height: 5\' 1"  (1.549 m)   Body mass index is 23.54 kg/m.  Advanced Directives 07/17/2020 08/09/2019 12/29/2017 08/28/2017 12/27/2016  Does Patient Have a Medical Advance Directive? Yes No Yes No No  Type of 12/29/2016 of [place];Living will - Healthcare Power of [place];Living will - -  Does patient want to make changes to medical advance directive? No - Patient declined - No - Patient declined - No - Patient declined  Copy of Healthcare Power of Attorney in Chart? No - copy requested - No - copy requested - -  Would patient like information on creating a medical advance directive? - - - No - Patient declined -    Current Medications (verified) Outpatient Encounter Medications as of 07/17/2020  Medication Sig  . cholecalciferol (EQL VITAMIN D-3) 400 units TABS tablet Take 800 Units by mouth daily.    No facility-administered encounter medications on file as of 07/17/2020.    Allergies (verified) Patient has no known allergies.   History: Past Medical History:  Diagnosis Date  . Chicken pox   . Vitamin D deficiency    Past Surgical History:  Procedure Laterality Date  . CATARACT EXTRACTION, BILATERAL  07/2019  . COLONOSCOPY WITH PROPOFOL N/A 08/09/2019   Procedure: COLONOSCOPY WITH PROPOFOL;  Surgeon: 08/11/2019, MD;  Location: [facility] ENDOSCOPY;  Service: Endoscopy;  Laterality: N/A;  . LEFT HEART CATH AND CORONARY ANGIOGRAPHY N/A 08/28/2017   Procedure: LEFT HEART CATH AND CORONARY ANGIOGRAPHY;  Surgeon: 08/30/2017, MD;  [facility];  Service: Cardiovascular;   Laterality: N/A;  . TUBAL LIGATION     Family History  Problem Relation Age of Onset  . Heart disease Mother        CHF  . Heart failure Mother   . Cancer Sister        lung  . Heart failure Brother   . Colon cancer Brother 7       colon  . Heart disease Sister        stent placement  . Breast cancer Neg Hx    Social History   Socioeconomic History  . Marital status: Single    Spouse name: Not on file  . Number of children: Not on file  . Years of education: Not on file  . Highest education level: Not on file  Occupational History  . Not on file  Tobacco Use  . Smoking status: Former Smoker    Packs/day: 2.00    Years: 44.00    Pack years: 88.00    Types: Cigarettes    Quit date: 2008    Years since quitting: 13.7  . Smokeless tobacco: Former 2009    Quit date: 08/10/1995  Vaping Use  . Vaping Use: Never used  Substance and Sexual Activity  . Alcohol use: Yes    Alcohol/week: 1.0 standard drink    Types: 1 Standard drinks or equivalent per week    Comment: beer OCC  . Drug use: No  . Sexual activity: Never  Other Topics Concern  . 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 Difficulty of Paying Living Expenses: Not hard at all  Food Insecurity: No Food Insecurity  . Worried About Programme researcher, broadcasting/film/video in the Last Year: Never true  . Ran Out of Food in the Last Year: Never true  Transportation Needs: No Transportation Needs  . Lack of Transportation (Medical): No  . Lack of Transportation (Non-Medical): No  Physical Activity: Sufficiently Active  . Days of Exercise per Week: 5 days  . Minutes of Exercise per Session: 30 min  Stress: No Stress Concern  Present  . Feeling of Stress : Not at all  Social Connections: Unknown  . Frequency of Communication with Friends and Family: More than three times a week  . Frequency of Social Gatherings with Friends and Family: More than three times a week  . Attends Religious Services: Not on file  . Active Member of Clubs or Organizations: Not on file  . Attends Banker Meetings: Not on file  . Marital Status: Not on file    Tobacco Counseling Counseling given: Not Answered   Clinical Intake:  Pre-visit preparation completed: Yes        Diabetes: No  How often do you need to have someone help you when you read instructions, pamphlets, or other written materials from your doctor or pharmacy?: 1 - Never Interpreter Needed?: No      Activities of Daily Living In your present state of health, do you have any difficulty performing the following activities: 07/17/2020  Hearing? N  Vision? N  Difficulty concentrating or making decisions? N  Walking or climbing stairs? N  Dressing or bathing? N  Doing errands, shopping? N  Preparing Food and eating ? N  Using the Toilet? N  In the past six months, have you accidently leaked urine? N  Do you have problems with loss of bowel control? N  Managing your Medications? N  Managing your Finances? N  Housekeeping or managing your Housekeeping? N  Some recent data might be hidden    Patient Care Team: Allegra Grana, FNP as PCP - General (Family Medicine)  Indicate any recent Medical Services you may have received from other than [facility] providers in the past year (date may be approximate).     Assessment:   This is a routine wellness examination for Jill Arellano.  I connected with Jill Arellano today by telephone and verified that I am speaking with the correct person using two identifiers. Location patient: home Location provider: work Persons participating in the virtual visit: patient, Engineer, civil (consulting).    I discussed the limitations, risks,  security and privacy concerns of performing an evaluation and management service by telephone and the availability of in person appointments. The patient expressed understanding and verbally consented to this telephonic visit.    Interactive audio and video telecommunications were attempted between this provider and patient, however failed, due to patient having technical difficulties OR patient did not have access to video capability.  We continued and completed visit with audio only.  Some vital signs may be  absent or patient reported.   Hearing/Vision screen  Hearing Screening   125Hz  250Hz  500Hz  1000Hz  2000Hz  3000Hz  4000Hz  6000Hz  8000Hz   Right ear:           Left ear:           Comments: Patient is able to hear conversational tones without difficulty.  No issues reported.  Vision Screening Comments: Followed by [facility]  Wears corrective lenses  Visual acuity not assessed per patient preference since they have regular follow up with the ophthalmologist  Dietary issues and exercise activities discussed: Current Exercise Habits: Home exercise routine, Type of exercise: walking, Time (Minutes): 30, Frequency (Times/Week): 5, Weekly Exercise (Minutes/Week): 150, Intensity: Mild  Goals      Patient Stated   .  I would like to lower my cholesterol (pt-stated)      Stay active Healthy diet      Depression Screen PHQ 2/9 Scores 07/17/2020 07/26/2019 12/29/2017 06/17/2017 05/13/2017 12/27/2016 01/31/2016  PHQ - 2 Score 0 0 0 0 0 0 0    Fall Risk Fall Risk  07/17/2020 07/26/2019 12/29/2017 06/17/2017 05/13/2017  Falls in the past year? 0 0 No No No  Number falls in past yr: 0 0 - - -  Follow up Falls evaluation completed Falls evaluation completed - - -   Handrails in use when climbing stairs? Yes Home free of loose throw rugs in walkways, pet beds, electrical cords, etc? Yes  Adequate lighting in your home to reduce risk of falls? Yes   ASSISTIVE DEVICES UTILIZED TO PREVENT  FALLS: Life alert? No  Use of a cane, walker or w/c? No   TIMED UP AND GO: Was the test performed? No . Virtual visit.   Cognitive Function: Patient is alert and oriented x3.  Denies difficulty focusing, making decisions, memory delay.  Enjoys reading for brain health.   MMSE - Mini Mental State Exam 12/27/2016  Orientation to time 5  Orientation to Place 5  Registration 3  Attention/ Calculation 5  Recall 3  Language- name 2 objects 2  Language- repeat 1  Language- follow 3 step command 3  Language- read & follow direction 1  Write a sentence 1  Copy design 1  Total score 30       Immunizations Immunization History  Administered Date(s) Administered  . Fluad Quad(high Dose 65+) 07/27/2019  . Influenza Split 08/28/2010  . Influenza-Unspecified 07/10/2020  . PFIZER SARS-COV-2 Vaccination 11/26/2019, 12/24/2019  . Pneumococcal Conjugate-13 12/27/2016  . Pneumococcal Polysaccharide-23 11/22/2014  . Tdap 01/28/2011  . Zoster 02/14/2014    Health Maintenance There are no preventive care reminders to display for this patient. Health Maintenance  Topic Date Due  . TETANUS/TDAP  01/27/2021  . COLONOSCOPY  08/08/2021  . MAMMOGRAM  08/17/2021  . INFLUENZA VACCINE  Completed  . DEXA SCAN  Completed  . COVID-19 Vaccine  Completed  . Hepatitis C Screening  Completed  . PNA vac Low Risk Adult  Completed   Dental Screening: Recommended annual dental exams for proper oral hygiene. Dentures.  Community Resource Referral / Chronic Care Management: CRR required this visit?  No   CCM required this visit?  No      Plan:   Keep all routine maintenance appointments.   Follow up 08/07/20 @ 2:00  I have personally reviewed and noted the following in the patient's chart:   . Medical and social history . Use of alcohol, tobacco or illicit drugs  . Current medications and supplements .  Functional ability and status . Nutritional status . Physical activity . Advanced  directives . List of other physicians . Hospitalizations, surgeries, and ER visits in previous 12 months . Vitals . Screenings to include cognitive, depression, and falls . Referrals and appointments  In addition, I have reviewed and discussed with patient certain preventive protocols, quality metrics, and best practice recommendations. A written personalized care plan for preventive services as well as general preventive health recommendations were provided to patient via mychart.     Ashok Pall, LPN   08/22/4173     Agree with plan. Rennie Plowman, NP

## 2020-07-26 ENCOUNTER — Encounter: Payer: Medicare PPO | Admitting: Family

## 2020-07-27 DIAGNOSIS — H04123 Dry eye syndrome of bilateral lacrimal glands: Secondary | ICD-10-CM | POA: Diagnosis not present

## 2020-08-07 ENCOUNTER — Ambulatory Visit: Payer: Medicare HMO | Admitting: Family

## 2020-08-12 DIAGNOSIS — S61011A Laceration without foreign body of right thumb without damage to nail, initial encounter: Secondary | ICD-10-CM | POA: Diagnosis not present

## 2020-08-12 DIAGNOSIS — R030 Elevated blood-pressure reading, without diagnosis of hypertension: Secondary | ICD-10-CM | POA: Diagnosis not present

## 2020-08-12 DIAGNOSIS — Z23 Encounter for immunization: Secondary | ICD-10-CM | POA: Diagnosis not present

## 2020-08-14 DIAGNOSIS — Z961 Presence of intraocular lens: Secondary | ICD-10-CM | POA: Diagnosis not present

## 2020-08-14 DIAGNOSIS — H18413 Arcus senilis, bilateral: Secondary | ICD-10-CM | POA: Diagnosis not present

## 2020-08-14 DIAGNOSIS — H524 Presbyopia: Secondary | ICD-10-CM | POA: Diagnosis not present

## 2020-08-14 DIAGNOSIS — Z135 Encounter for screening for eye and ear disorders: Secondary | ICD-10-CM | POA: Diagnosis not present

## 2020-08-14 DIAGNOSIS — Z0100 Encounter for examination of eyes and vision without abnormal findings: Secondary | ICD-10-CM | POA: Diagnosis not present

## 2020-09-11 ENCOUNTER — Encounter: Payer: Self-pay | Admitting: Family

## 2020-09-11 ENCOUNTER — Other Ambulatory Visit: Payer: Self-pay

## 2020-09-11 ENCOUNTER — Ambulatory Visit (INDEPENDENT_AMBULATORY_CARE_PROVIDER_SITE_OTHER): Payer: Medicare HMO | Admitting: Family

## 2020-09-11 VITALS — BP 134/62 | HR 59 | Temp 97.9°F | Ht 61.0 in | Wt 126.4 lb

## 2020-09-11 DIAGNOSIS — I700 Atherosclerosis of aorta: Secondary | ICD-10-CM

## 2020-09-11 DIAGNOSIS — Z13820 Encounter for screening for osteoporosis: Secondary | ICD-10-CM | POA: Diagnosis not present

## 2020-09-11 DIAGNOSIS — Z1231 Encounter for screening mammogram for malignant neoplasm of breast: Secondary | ICD-10-CM

## 2020-09-11 LAB — LIPID PANEL
Cholesterol: 251 mg/dL — ABNORMAL HIGH (ref 0–200)
HDL: 116.4 mg/dL (ref >39.00)
LDL Cholesterol: 123 mg/dL — ABNORMAL HIGH (ref 0–99)
NonHDL: 134.36
Total CHOL/HDL Ratio: 2
Triglycerides: 57 mg/dL (ref 0.0–149.0)
VLDL: 11.4 mg/dL (ref 0.0–40.0)

## 2020-09-11 NOTE — Assessment & Plan Note (Signed)
Ordered, patient will schedule 

## 2020-09-11 NOTE — Assessment & Plan Note (Signed)
Ordered.  Patient will schedule. 

## 2020-09-11 NOTE — Assessment & Plan Note (Signed)
Pending lipid panel today. If CVD risk remains elevated, patient agreeable to starting statin.

## 2020-09-11 NOTE — Patient Instructions (Signed)
Nice to see you!  Please call  and schedule your 3D mammogram, bone density scan as discussed.   [facility]  [address]  [place], [place]  [phone]

## 2020-09-11 NOTE — Progress Notes (Signed)
Subjective:    Patient ID: Jill Arellano, female    DOB: 29-Apr-1951, 69 y.o.   MRN: [id number]  CC: Jill Arellano is a 69 y.o. female who presents today for follow up.   HPI: Here today primarily to discuss cholesterol and to recheck cholesterol. She has been working on lifestyle and eating healthier. She would like to start statin if cholesterol remains elevated.   Feels well, no complaints.   No depression, anxiety. She is sleeping well.   She checks blood pressure at  Home on occasion and it is usually in 120/60s.  Due DEXA, mammogram.   Ct lung cancer screen completed 06/2020- shows aortic atherosclerosis, cholelithiasis, Lung RADS-2 HISTORY:  Past Medical History:  Diagnosis Date  . Chicken pox   . Vitamin D deficiency    Past Surgical History:  Procedure Laterality Date  . CATARACT EXTRACTION, BILATERAL  07/2019  . COLONOSCOPY WITH PROPOFOL N/A 08/09/2019   Procedure: COLONOSCOPY WITH PROPOFOL;  Surgeon: Jill Spillers, MD;  Location: [facility] ENDOSCOPY;  Service: Endoscopy;  Laterality: N/A;  . LEFT HEART CATH AND CORONARY ANGIOGRAPHY N/A 08/28/2017   Procedure: LEFT HEART CATH AND CORONARY ANGIOGRAPHY;  Surgeon: Jill Gess, MD;  [facility];  Service: Cardiovascular;  Laterality: N/A;  . TUBAL LIGATION     Family History  Problem Relation Age of Onset  . Heart disease Mother        CHF  . Heart failure Mother   . Cancer Sister        lung  . Heart failure Brother   . Colon cancer Brother 71       colon  . Heart disease Sister        stent placement  . Breast cancer Neg Hx     Allergies: Patient has no known allergies. Current Outpatient Medications on File Prior to Visit  Medication Sig Dispense Refill  . cholecalciferol (EQL VITAMIN D-3) 400 units TABS tablet Take 800 Units by mouth daily.      No current facility-administered medications on file prior to visit.    Social History   Tobacco Use  . Smoking status:  Former Smoker    Packs/day: 2.00    Years: 44.00    Pack years: 88.00    Types: Cigarettes    Quit date: 2008    Years since quitting: 13.9  . Smokeless tobacco: Former Neurosurgeon    Quit date: 08/10/1995  Vaping Use  . Vaping Use: Never used  Substance Use Topics  . Alcohol use: Yes    Alcohol/week: 1.0 standard drink    Types: 1 Standard drinks or equivalent per week    Comment: beer OCC  . Drug use: No    Review of Systems  Constitutional: Negative for chills and fever.  Respiratory: Negative for cough.   Cardiovascular: Negative for chest pain and palpitations.  Gastrointestinal: Negative for nausea and vomiting.      Objective:    BP 134/62   Pulse (!) 59   Temp 97.9 F (36.6 C)   Ht 5\' 1"  (1.549 m)   Wt 126 lb 6.4 oz (57.3 kg)   SpO2 98%   BMI 23.88 kg/m  BP Readings from Last 3 Encounters:  09/11/20 134/62  07/17/20 (!) 124/52  01/31/20 122/62   Wt Readings from Last 3 Encounters:  09/11/20 126 lb 6.4 oz (57.3 kg)  07/17/20 124 lb 9.6 oz (56.5 kg)  07/10/20 124 lb 3.2 oz (56.3 kg)  Physical Exam Vitals reviewed.  Constitutional:      Appearance: She is well-developed.  Eyes:     Conjunctiva/sclera: Conjunctivae normal.  Cardiovascular:     Rate and Rhythm: Normal rate and regular rhythm.     Pulses: Normal pulses.     Heart sounds: Normal heart sounds.  Pulmonary:     Effort: Pulmonary effort is normal.     Breath sounds: Normal breath sounds. No wheezing, rhonchi or rales.  Skin:    General: Skin is warm and dry.  Neurological:     Mental Status: She is alert.  Psychiatric:        Speech: Speech normal.        Behavior: Behavior normal.        Thought Content: Thought content normal.        Assessment & Plan:   Problem List Items Addressed This Visit      Cardiovascular and Mediastinum   Atherosclerosis of aorta (HCC) - Primary    Pending lipid panel today. If CVD risk remains elevated, patient agreeable to starting statin.        Relevant Orders   Lipid panel     Other   Screening for breast cancer    Ordered. Patient will schedule      Relevant Orders   MM 3D SCREEN BREAST BILATERAL   Screening for osteoporosis    Ordered, patient will schedule.      Relevant Orders   DG Bone Density       I am having Jill Arellano. Matar maintain her cholecalciferol.   No orders of the defined types were placed in this encounter.   Return precautions given.   Risks, benefits, and alternatives of the medications and treatment plan prescribed today were discussed, and patient expressed understanding.   Education regarding symptom management and diagnosis given to patient on AVS.  Continue to follow with Allegra Grana, FNP for routine health maintenance.   Jill Arellano and I agreed with plan.   Jill Plowman, FNP

## 2020-09-12 ENCOUNTER — Other Ambulatory Visit: Payer: Self-pay

## 2020-09-12 MED ORDER — ROSUVASTATIN CALCIUM 5 MG PO TABS: 5.0000 mg | ORAL_TABLET | Freq: Every day | ORAL | 3 refills | Status: DC

## 2020-09-13 ENCOUNTER — Telehealth: Payer: Self-pay

## 2020-09-13 NOTE — Telephone Encounter (Signed)
Noted. Thanks.

## 2020-09-13 NOTE — Telephone Encounter (Signed)
LMTCB to inform patient of below.

## 2020-09-13 NOTE — Telephone Encounter (Signed)
Patient called  Was given the message about bill and she understood and will wait

## 2020-09-13 NOTE — Telephone Encounter (Signed)
-----   Message from Allegra Grana, FNP sent at 09/12/2020  5:58 PM EST ----- Regarding: FW: Call pt I have spoken with dawn who let Erie Noe know with billing that insurance should cover BOTH annual CPE with me and Medicare wellness with Denisa. The visits are to be rebilled. Advise pt to hold on paying bill and call us if after resubmission any issues ----- Message ----- From: Meda Klinefelter Sent: 09/12/2020   4:59 PM EST To: Melburn Hake, FNP  Hello,   Per billing : The Rep reviewed the pt account and was not able to find where this CPT code had been billed out yet this year . He is going to send the claim back to be reprocessed as this CPT code has not been billed out and paid yet this Year ; Rep Raiford Noble ref# [id number]   I also CC'd Erie Noe so she is aware.   Thanks Dawn ----- Message ----- From: Allegra Grana, FNP Sent: 09/12/2020  12:25 PM EST To: Meda Klinefelter  Thanks let me know so I can relay to patient ----- Message ----- From: Meda Klinefelter Sent: 09/11/2020   9:30 AM EST To: Allegra Grana, FNP  I am thinking this denied in error by [facility], they pay for both AWV and the CPE.  I will have billing call [employer] to research.  Thank you, Dawn ----- Message ----- From: Allegra Grana, FNP Sent: 09/11/2020   8:30 AM EST To: Meda Klinefelter  Dawn,  Can you help here?  Patient has to pay ~$300 for medicare wellness visit she has with denisa.  She says that since she had a CPE with me, they would not pay for visit with denisa.  How can we correct?  And how can we prevent this from happening next year?  M

## 2020-10-16 ENCOUNTER — Ambulatory Visit
Admission: RE | Admit: 2020-10-16 | Discharge: 2020-10-16 | Disposition: A | Discharge status: Home / Self Care | Payer: Medicare HMO | Source: Ambulatory Visit | Attending: Family | Admitting: Family

## 2020-10-16 ENCOUNTER — Other Ambulatory Visit: Payer: Self-pay | Admitting: Family

## 2020-10-16 ENCOUNTER — Other Ambulatory Visit: Payer: Self-pay

## 2020-10-16 DIAGNOSIS — Z13820 Encounter for screening for osteoporosis: Secondary | ICD-10-CM

## 2020-10-16 DIAGNOSIS — Z780 Asymptomatic menopausal state: Secondary | ICD-10-CM | POA: Diagnosis not present

## 2020-10-16 DIAGNOSIS — Z1231 Encounter for screening mammogram for malignant neoplasm of breast: Secondary | ICD-10-CM | POA: Diagnosis not present

## 2020-10-16 DIAGNOSIS — R928 Other abnormal and inconclusive findings on diagnostic imaging of breast: Secondary | ICD-10-CM

## 2020-10-16 DIAGNOSIS — M8589 Other specified disorders of bone density and structure, multiple sites: Secondary | ICD-10-CM | POA: Insufficient documentation

## 2020-10-16 DIAGNOSIS — N6489 Other specified disorders of breast: Secondary | ICD-10-CM

## 2020-10-17 ENCOUNTER — Other Ambulatory Visit: Payer: Self-pay

## 2020-10-17 DIAGNOSIS — I700 Atherosclerosis of aorta: Secondary | ICD-10-CM

## 2020-10-25 ENCOUNTER — Other Ambulatory Visit: Payer: Self-pay

## 2020-10-25 ENCOUNTER — Ambulatory Visit
Admission: RE | Admit: 2020-10-25 | Discharge: 2020-10-25 | Disposition: A | Discharge status: Home / Self Care | Payer: Medicare HMO | Source: Ambulatory Visit | Attending: Family | Admitting: Family

## 2020-10-25 DIAGNOSIS — R928 Other abnormal and inconclusive findings on diagnostic imaging of breast: Secondary | ICD-10-CM

## 2020-10-25 DIAGNOSIS — N6489 Other specified disorders of breast: Secondary | ICD-10-CM

## 2020-10-25 DIAGNOSIS — R922 Inconclusive mammogram: Secondary | ICD-10-CM | POA: Diagnosis not present

## 2020-10-30 ENCOUNTER — Other Ambulatory Visit: Payer: Medicare HMO

## 2020-11-06 ENCOUNTER — Other Ambulatory Visit: Payer: Self-pay

## 2020-11-06 ENCOUNTER — Other Ambulatory Visit (INDEPENDENT_AMBULATORY_CARE_PROVIDER_SITE_OTHER): Payer: Medicare HMO

## 2020-11-06 DIAGNOSIS — I700 Atherosclerosis of aorta: Secondary | ICD-10-CM | POA: Diagnosis not present

## 2020-11-06 LAB — COMPREHENSIVE METABOLIC PANEL
ALT: 17 U/L (ref 0–35)
AST: 15 U/L (ref 0–37)
Albumin: 4.6 g/dL (ref 3.5–5.2)
Alkaline Phosphatase: 56 U/L (ref 39–117)
BUN: 24 mg/dL — ABNORMAL HIGH (ref 6–23)
CO2: 27 mEq/L (ref 19–32)
Calcium: 9.4 mg/dL (ref 8.4–10.5)
Chloride: 101 mEq/L (ref 96–112)
Creatinine, Ser: 0.7 mg/dL (ref 0.40–1.20)
GFR: 87.08 mL/min (ref >60.00)
Glucose, Bld: 74 mg/dL (ref 70–99)
Potassium: 4.8 mEq/L (ref 3.5–5.1)
Sodium: 136 mEq/L (ref 135–145)
Total Bilirubin: 0.5 mg/dL (ref 0.2–1.2)
Total Protein: 6.9 g/dL (ref 6.0–8.3)

## 2020-11-07 DIAGNOSIS — L02224 Furuncle of groin: Secondary | ICD-10-CM | POA: Diagnosis not present

## 2020-11-27 ENCOUNTER — Other Ambulatory Visit: Payer: Self-pay

## 2020-11-27 ENCOUNTER — Encounter: Payer: Self-pay | Admitting: Family

## 2020-11-27 ENCOUNTER — Ambulatory Visit (INDEPENDENT_AMBULATORY_CARE_PROVIDER_SITE_OTHER): Payer: Medicare HMO | Admitting: Family

## 2020-11-27 VITALS — BP 150/80 | HR 64 | Temp 98.3°F | Ht 61.0 in | Wt 127.4 lb

## 2020-11-27 DIAGNOSIS — R030 Elevated blood-pressure reading, without diagnosis of hypertension: Secondary | ICD-10-CM

## 2020-11-27 DIAGNOSIS — M8580 Other specified disorders of bone density and structure, unspecified site: Secondary | ICD-10-CM | POA: Diagnosis not present

## 2020-11-27 DIAGNOSIS — I10 Essential (primary) hypertension: Secondary | ICD-10-CM | POA: Insufficient documentation

## 2020-11-27 DIAGNOSIS — I700 Atherosclerosis of aorta: Secondary | ICD-10-CM | POA: Diagnosis not present

## 2020-11-27 MED ORDER — AMLODIPINE BESYLATE 2.5 MG PO TABS: 2.5000 mg | ORAL_TABLET | Freq: Every day | ORAL | 3 refills | Status: DC

## 2020-11-27 NOTE — Patient Instructions (Addendum)
Start amlodipine. Take for blood pressure if greater than 130/80.      Nice to you !  For post menopausal women, guidelines recommend a diet with 1200 mg of Calcium per day. If you are eating calcium rich foods, you do not need a calcium supplement. The body better absorbs the calcium that you eat over supplementation. If you do supplement, I recommend not supplementing the full 1200 mg/ day as this can lead to increased risk of cardiovascular disease. I recommend Calcium Citrate over the counter, and you may take a total of 200 to 400 mg per day in divided doses with meals for best absorption.   For bone health, you need adequate vitamin D, and I recommend you supplement as it is harder to do so with diet alone. I recommend cholecalciferol 800 units daily.  Also, please ensure you are following a diet high in calcium -- research shows better outcomes with dietary sources including kale, yogurt, broccolii, cheese, okra, almonds- to name a few.     Also remember that exercise is a great medicine for maintain and preserve bone health. Advise moderate exercise for 30 minutes , 3 times per week.

## 2020-11-27 NOTE — Assessment & Plan Note (Signed)
Uncontrolled. This is new. We agreed that trial of amlodipine 2.5mg  for BP > 135/80 is appropriate. Close follow up

## 2020-11-27 NOTE — Assessment & Plan Note (Signed)
Anticipate improved. Continue crestor 5mg 

## 2020-11-27 NOTE — Progress Notes (Signed)
Subjective:    Patient ID: Jill Arellano, female    DOB: [birth year], 70 y.o.   MRN: [id number]  CC: Jill Arellano is a 70 y.o. female who presents today for follow up.   HPI:  Here today to discuss recent DEXA.  Walking 3 x per week, 3 miles. No cp,sob. No weights.   Compliant vitamin D 800 units QD. She is not taking calcium supplement.   Compliant with crestor 5mg . Tolerating medication.  She is taking OTC fish oil daily.   Elevated blood pressure. At home 145-155/ 54-60, on average. No cp. Weight is stable. No nsaids. Two to three 6 ounce cups coffee. She has never been on blood pressure medication. No salt indiscretion. No decongestants. No alcohol.      Former smoker Never fracture    HISTORY:  Past Medical History:  Diagnosis Date  . Chicken pox   . Vitamin D deficiency    Past Surgical History:  Procedure Laterality Date  . CATARACT EXTRACTION, BILATERAL  07/2019  . COLONOSCOPY WITH PROPOFOL N/A 08/09/2019   Procedure: COLONOSCOPY WITH PROPOFOL;  Surgeon: 08/11/2019, MD;  Location: [facility] ENDOSCOPY;  Service: Endoscopy;  Laterality: N/A;  . LEFT HEART CATH AND CORONARY ANGIOGRAPHY N/A 08/28/2017   Procedure: LEFT HEART CATH AND CORONARY ANGIOGRAPHY;  Surgeon: 08/30/2017, MD;  [facility];  Service: Cardiovascular;  Laterality: N/A;  . TUBAL LIGATION     Family History  Problem Relation Age of Onset  . Heart disease Mother        CHF  . Heart failure Mother   . Osteoporosis Mother   . Cancer Sister        lung  . Heart failure Brother   . Colon cancer Brother 2       colon  . Heart disease Sister        stent placement  . Breast cancer Neg Hx     Allergies: Patient has no known allergies. Current Outpatient Medications on File Prior to Visit  Medication Sig Dispense Refill  . cholecalciferol (VITAMIN D3) 10 MCG (400 UNIT) TABS tablet Take 800 Units by mouth daily.     . rosuvastatin (CRESTOR) 5 MG tablet Take  1 tablet (5 mg total) by mouth daily. 90 tablet 3   No current facility-administered medications on file prior to visit.    Social History   Tobacco Use  . Smoking status: Former Smoker    Packs/day: 2.00    Years: 44.00    Pack years: 88.00    Types: Cigarettes    Quit date: 2008    Years since quitting: 14.1  . Smokeless tobacco: Former 2009    Quit date: 08/10/1995  Vaping Use  . Vaping Use: Never used  Substance Use Topics  . Alcohol use: Yes    Alcohol/week: 1.0 standard drink    Types: 1 Standard drinks or equivalent per week    Comment: beer OCC  . Drug use: No    Review of Systems  Constitutional: Negative for chills and fever.  Respiratory: Negative for cough.   Cardiovascular: Negative for chest pain and palpitations.  Gastrointestinal: Negative for nausea and vomiting.      Objective:    BP (!) 150/80   Pulse 64   Temp 98.3 F (36.8 C) (Oral)   Ht 5\' 1"  (1.549 m)   Wt 127 lb 6.4 oz (57.8 kg)   BMI 24.07 kg/m  BP Readings from Last  3 Encounters:  11/27/20 (!) 150/80  09/11/20 134/62  07/17/20 (!) 124/52   Wt Readings from Last 3 Encounters:  11/27/20 127 lb 6.4 oz (57.8 kg)  09/11/20 126 lb 6.4 oz (57.3 kg)  07/17/20 124 lb 9.6 oz (56.5 kg)    Physical Exam Vitals reviewed.  Constitutional:      Appearance: She is well-developed and well-nourished.  Eyes:     Conjunctiva/sclera: Conjunctivae normal.  Cardiovascular:     Rate and Rhythm: Normal rate and regular rhythm.     Pulses: Normal pulses.     Heart sounds: Normal heart sounds.  Pulmonary:     Effort: Pulmonary effort is normal.     Breath sounds: Normal breath sounds. No wheezing, rhonchi or rales.  Skin:    General: Skin is warm and dry.  Neurological:     Mental Status: She is alert.  Psychiatric:        Mood and Affect: Mood and affect normal.        Speech: Speech normal.        Behavior: Behavior normal.        Thought Content: Thought content normal.         Assessment & Plan:   Problem List Items Addressed This Visit      Cardiovascular and Mediastinum   Atherosclerosis of aorta (HCC)    Anticipate improved. Continue crestor 5mg       Relevant Medications   amLODipine (NORVASC) 2.5 MG tablet     Musculoskeletal and Integument   Osteopenia - Primary    Reviewed DEXA report and FRAX risk. She doesn't meet criteria to start medication and we discussed recommended daily doses of calcium, vitamin D. Congratulated her on walking and advised to incorporate weights with walking           Other   Elevated blood pressure reading    Uncontrolled. This is new. We agreed that trial of amlodipine 2.5mg  for BP > 135/80 is appropriate. Close follow up      Relevant Medications   amLODipine (NORVASC) 2.5 MG tablet       I am having . Durden start on amLODipine. I am also having her maintain her cholecalciferol and rosuvastatin.   Meds ordered this encounter  Medications  . amLODipine (NORVASC) 2.5 MG tablet    Sig: Take 1 tablet (2.5 mg total) by mouth daily.    Dispense:  90 tablet    Refill:  3    Order Specific Question:   Supervising Provider    Answer:   Anette Riedel [[id number]]    Return precautions given.   Risks, benefits, and alternatives of the medications and treatment plan prescribed today were discussed, and patient expressed understanding.   Education regarding symptom management and diagnosis given to patient on AVS.  Continue to follow with Sherlene Shams, FNP for routine health maintenance.   Allegra Grana and I agreed with plan.   [name], FNP

## 2020-11-27 NOTE — Assessment & Plan Note (Addendum)
Reviewed DEXA report and FRAX risk. She doesn't meet criteria to start medication and we discussed recommended daily doses of calcium, vitamin D. Congratulated her on walking and advised to incorporate weights with walking

## 2021-01-08 ENCOUNTER — Encounter: Payer: Self-pay | Admitting: Family

## 2021-01-08 ENCOUNTER — Other Ambulatory Visit: Payer: Self-pay

## 2021-01-08 ENCOUNTER — Ambulatory Visit (INDEPENDENT_AMBULATORY_CARE_PROVIDER_SITE_OTHER): Payer: Medicare HMO | Admitting: Family

## 2021-01-08 VITALS — BP 134/64 | HR 65 | Temp 97.9°F | Ht 61.0 in | Wt 127.8 lb

## 2021-01-08 DIAGNOSIS — R030 Elevated blood-pressure reading, without diagnosis of hypertension: Secondary | ICD-10-CM

## 2021-01-08 DIAGNOSIS — I700 Atherosclerosis of aorta: Secondary | ICD-10-CM

## 2021-01-08 DIAGNOSIS — I2510 Atherosclerotic heart disease of native coronary artery without angina pectoris: Secondary | ICD-10-CM | POA: Diagnosis not present

## 2021-01-08 LAB — VITAMIN D 25 HYDROXY (VIT D DEFICIENCY, FRACTURES): VITD: 36.22 ng/mL (ref 30.00–100.00)

## 2021-01-08 LAB — CBC WITH DIFFERENTIAL/PLATELET
Basophils Absolute: 0 10*3/uL (ref 0.0–0.1)
Basophils Relative: 0.4 % (ref 0.0–3.0)
Eosinophils Absolute: 0.1 10*3/uL (ref 0.0–0.7)
Eosinophils Relative: 1.9 % (ref 0.0–5.0)
HCT: 41 % (ref 36.0–46.0)
Hemoglobin: 13.8 g/dL (ref 12.0–15.0)
Lymphocytes Relative: 46.3 % — ABNORMAL HIGH (ref 12.0–46.0)
Lymphs Abs: 2 10*3/uL (ref 0.7–4.0)
MCHC: 33.7 g/dL (ref 30.0–36.0)
MCV: 90.3 fl (ref 78.0–100.0)
Monocytes Absolute: 0.3 10*3/uL (ref 0.1–1.0)
Monocytes Relative: 7.8 % (ref 3.0–12.0)
Neutro Abs: 1.9 10*3/uL (ref 1.4–7.7)
Neutrophils Relative %: 43.6 % (ref 43.0–77.0)
Platelets: 204 10*3/uL (ref 150.0–400.0)
RBC: 4.53 Mil/uL (ref 3.87–5.11)
RDW: 12.2 % (ref 11.5–15.5)
WBC: 4.3 10*3/uL (ref 4.0–10.5)

## 2021-01-08 LAB — LIPID PANEL
Cholesterol: 230 mg/dL — ABNORMAL HIGH (ref 0–200)
HDL: 128.1 mg/dL (ref >39.00)
LDL Cholesterol: 90 mg/dL (ref 0–99)
NonHDL: 102.05
Total CHOL/HDL Ratio: 2
Triglycerides: 62 mg/dL (ref 0.0–149.0)
VLDL: 12.4 mg/dL (ref 0.0–40.0)

## 2021-01-08 LAB — TSH: TSH: 1.86 u[IU]/mL (ref 0.35–4.50)

## 2021-01-08 MED ORDER — AMLODIPINE BESYLATE 5 MG PO TABS: 5.0000 mg | ORAL_TABLET | Freq: Every day | ORAL | 1 refills | Status: DC

## 2021-01-08 NOTE — Assessment & Plan Note (Signed)
Anticipate improved. Continue crestor 5mg 

## 2021-01-08 NOTE — Assessment & Plan Note (Signed)
Improved overall however range at home 141/54. We agreed that trial of amlodipine 5mg  to see if we can more consistently approach < 130/80.

## 2021-01-08 NOTE — Patient Instructions (Addendum)
Increase amlodipine to 5mg  It is imperative that you are seen AT least twice per year for labs and monitoring. Monitor blood pressure at home and me 5-6 reading on separate days. Goal is less than 120/80, based on newest guidelines, however we certainly want to be less than 130/80;  if persistently higher, please make sooner follow up appointment so we can recheck you blood pressure and manage/ adjust medications.  Call GI Dr office and schedule colonoscopy , due 07/2021.   [phone]  Nice to see you!

## 2021-01-08 NOTE — Progress Notes (Signed)
Subjective:    Patient ID: Jill Arellano, female    DOB: 04-11-1951, 70 y.o.   MRN: [id number]  CC: Jill Arellano is a 70 y.o. female who presents today for follow up.   HPI: Feels well today No new complaints  HTN- compliant with norvasc 2.5mg  qhs. at home  127-157/54. She states BP average 141/54.  No cp, sob, leg swelling.   Atherosclerosis of aorta- compliant with crestor 5mg .    HISTORY:  Past Medical History:  Diagnosis Date  . Chicken pox   . Vitamin D deficiency    Past Surgical History:  Procedure Laterality Date  . CATARACT EXTRACTION, BILATERAL  07/2019  . COLONOSCOPY WITH PROPOFOL N/A 08/09/2019   Procedure: COLONOSCOPY WITH PROPOFOL;  Surgeon: 08/11/2019, MD;  Location: [facility];  Service: Endoscopy;  Laterality: N/A;  . LEFT HEART CATH AND CORONARY ANGIOGRAPHY N/A 08/28/2017   Procedure: LEFT HEART CATH AND CORONARY ANGIOGRAPHY;  Surgeon: 08/30/2017, MD;  Location: [facility];  Service: Cardiovascular;  Laterality: N/A;  . TUBAL LIGATION     Family History  Problem Relation Age of Onset  . Heart disease Mother        CHF  . Heart failure Mother   . Osteoporosis Mother   . Cancer Sister        lung  . Heart failure Brother   . Colon cancer Brother 39       colon  . Heart disease Sister        stent placement  . Breast cancer Neg Hx     Allergies: Patient has no known allergies. Current Outpatient Medications on File Prior to Visit  Medication Sig Dispense Refill  . cholecalciferol (VITAMIN D3) 10 MCG (400 UNIT) TABS tablet Take 800 Units by mouth daily.     . rosuvastatin (CRESTOR) 5 MG tablet Take 1 tablet (5 mg total) by mouth daily. 90 tablet 3   No current facility-administered medications on file prior to visit.    Social History   Tobacco Use  . Smoking status: Former Smoker    Packs/day: 2.00    Years: 44.00    Pack years: 88.00    Types: Cigarettes    Quit date: 2008    Years since quitting:  14.2  . Smokeless tobacco: Former 2009    Quit date: 08/10/1995  Vaping Use  . Vaping Use: Never used  Substance Use Topics  . Alcohol use: Yes    Alcohol/week: 1.0 standard drink    Types: 1 Standard drinks or equivalent per week    Comment: beer OCC  . Drug use: No    Review of Systems  Constitutional: Negative for chills and fever.  Respiratory: Negative for cough and shortness of breath.   Cardiovascular: Negative for chest pain, palpitations and leg swelling.  Gastrointestinal: Negative for nausea and vomiting.      Objective:    BP 134/64   Pulse 65   Temp 97.9 F (36.6 C)   Ht 5\' 1"  (1.549 m)   Wt 127 lb 12.8 oz (58 kg)   SpO2 97%   BMI 24.15 kg/m  BP Readings from Last 3 Encounters:  01/08/21 134/64  11/27/20 (!) 150/80  09/11/20 134/62   Wt Readings from Last 3 Encounters:  01/08/21 127 lb 12.8 oz (58 kg)  11/27/20 127 lb 6.4 oz (57.8 kg)  09/11/20 126 lb 6.4 oz (57.3 kg)    Physical Exam Vitals reviewed.  Constitutional:  Appearance: She is well-developed.  Eyes:     Conjunctiva/sclera: Conjunctivae normal.  Cardiovascular:     Rate and Rhythm: Normal rate and regular rhythm.     Pulses: Normal pulses.     Heart sounds: Normal heart sounds.  Pulmonary:     Effort: Pulmonary effort is normal.     Breath sounds: Normal breath sounds. No wheezing, rhonchi or rales.  Skin:    General: Skin is warm and dry.  Neurological:     Mental Status: She is alert.  Psychiatric:        Speech: Speech normal.        Behavior: Behavior normal.        Thought Content: Thought content normal.        Assessment & Plan:   Problem List Items Addressed This Visit      Cardiovascular and Mediastinum   Atherosclerosis of aorta (HCC)    Anticipate improved. Continue crestor 5mg       Relevant Medications   amLODipine (NORVASC) 5 MG tablet   Coronary artery disease involving native heart without angina pectoris - Primary   Relevant Medications    amLODipine (NORVASC) 5 MG tablet   Other Relevant Orders   Lipid panel   CBC with Differential/Platelet   VITAMIN D 25 Hydroxy (Vit-D Deficiency, Fractures)   TSH   HTN (hypertension)    Improved overall however range at home 141/54. We agreed that trial of amlodipine 5mg  to see if we can more consistently approach < 130/80.       Relevant Medications   amLODipine (NORVASC) 5 MG tablet       I have changed . Batten's amLODipine. I am also having her maintain her cholecalciferol and rosuvastatin.   Meds ordered this encounter  Medications  . amLODipine (NORVASC) 5 MG tablet    Sig: Take 1 tablet (5 mg total) by mouth daily.    Dispense:  90 tablet    Refill:  1    Order Specific Question:   Supervising Provider    Answer:   [[id number]]    Return precautions given.   Risks, benefits, and alternatives of the medications and treatment plan prescribed today were discussed, and patient expressed understanding.   Education regarding symptom management and diagnosis given to patient on AVS.  Continue to follow with Anette Riedel, FNP for routine health maintenance.   Sherlene Shams and I agreed with plan.   Allegra Grana, FNP

## 2021-03-19 ENCOUNTER — Ambulatory Visit (INDEPENDENT_AMBULATORY_CARE_PROVIDER_SITE_OTHER): Payer: Medicare HMO | Admitting: Family

## 2021-03-19 ENCOUNTER — Encounter: Payer: Self-pay | Admitting: Family

## 2021-03-19 ENCOUNTER — Other Ambulatory Visit: Payer: Self-pay

## 2021-03-19 VITALS — BP 130/66 | HR 57 | Temp 97.7°F | Ht 61.0 in | Wt 128.2 lb

## 2021-03-19 DIAGNOSIS — I700 Atherosclerosis of aorta: Secondary | ICD-10-CM | POA: Diagnosis not present

## 2021-03-19 DIAGNOSIS — Z0000 Encounter for general adult medical examination without abnormal findings: Secondary | ICD-10-CM | POA: Diagnosis not present

## 2021-03-19 DIAGNOSIS — I10 Essential (primary) hypertension: Secondary | ICD-10-CM | POA: Diagnosis not present

## 2021-03-19 LAB — CBC WITH DIFFERENTIAL/PLATELET
Basophils Absolute: 0 10*3/uL (ref 0.0–0.1)
Basophils Relative: 0.6 % (ref 0.0–3.0)
Eosinophils Absolute: 0.1 10*3/uL (ref 0.0–0.7)
Eosinophils Relative: 0.8 % (ref 0.0–5.0)
HCT: 41.8 % (ref 36.0–46.0)
Hemoglobin: 14.1 g/dL (ref 12.0–15.0)
Lymphocytes Relative: 26.4 % (ref 12.0–46.0)
Lymphs Abs: 1.9 10*3/uL (ref 0.7–4.0)
MCHC: 33.7 g/dL (ref 30.0–36.0)
MCV: 91.4 fl (ref 78.0–100.0)
Monocytes Absolute: 0.5 10*3/uL (ref 0.1–1.0)
Monocytes Relative: 6.6 % (ref 3.0–12.0)
Neutro Abs: 4.8 10*3/uL (ref 1.4–7.7)
Neutrophils Relative %: 65.6 % (ref 43.0–77.0)
Platelets: 246 10*3/uL (ref 150.0–400.0)
RBC: 4.58 Mil/uL (ref 3.87–5.11)
RDW: 12.7 % (ref 11.5–15.5)
WBC: 7.2 10*3/uL (ref 4.0–10.5)

## 2021-03-19 NOTE — Addendum Note (Signed)
Addended by: Warden Fillers on: 03/19/2021 09:15 AM   Modules accepted: Orders

## 2021-03-19 NOTE — Patient Instructions (Addendum)
Please call Dr Maximino Greenland to schedule colonscopy.   Call to make an appointment for Annual Lung cancer screen due 06/2021  With CT Chest: [phone]. Let me know if any issues in doing so.   Nice to see you!   Health Maintenance for Postmenopausal Women Menopause is a normal process in which your ability to get pregnant comes to an end. This process happens slowly over many months or years, usually between the ages of 74 and 57. Menopause is complete when you have missed your menstrual periods for 12 months. It is important to talk with your health care provider about some of the most common conditions that affect women after menopause (postmenopausal women). These include heart disease, cancer, and bone loss (osteoporosis). Adopting a healthy lifestyle and getting preventive care can help to promote your health and wellness. The actions you take can also lower your chances of developing some of these common conditions. What should I know about menopause? During menopause, you may get a number of symptoms, such as:  Hot flashes. These can be moderate or severe.  Night sweats.  Decrease in sex drive.  Mood swings.  Headaches.  Tiredness.  Irritability.  Memory problems.  Insomnia. Choosing to treat or not to treat these symptoms is a decision that you make with your health care provider. Do I need hormone replacement therapy?  Hormone replacement therapy is effective in treating symptoms that are caused by menopause, such as hot flashes and night sweats.  Hormone replacement carries certain risks, especially as you become older. If you are thinking about using estrogen or estrogen with progestin, discuss the benefits and risks with your health care provider. What is my risk for heart disease and stroke? The risk of heart disease, heart attack, and stroke increases as you age. One of the causes may be a change in the body's hormones during menopause. This can affect how your body  uses dietary fats, triglycerides, and cholesterol. Heart attack and stroke are medical emergencies. There are many things that you can do to help prevent heart disease and stroke. Watch your blood pressure  High blood pressure causes heart disease and increases the risk of stroke. This is more likely to develop in people who have high blood pressure readings, are of African descent, or are overweight.  Have your blood pressure checked: ? Every 3-5 years if you are 60-30 years of age. ? Every year if you are 66 years old or older. Eat a healthy diet  Eat a diet that includes plenty of vegetables, fruits, low-fat dairy products, and lean protein.  Do not eat a lot of foods that are high in solid fats, added sugars, or sodium.   Get regular exercise Get regular exercise. This is one of the most important things you can do for your health. Most adults should:  Try to exercise for at least 150 minutes each week. The exercise should increase your heart rate and make you sweat (moderate-intensity exercise).  Try to do strengthening exercises at least twice each week. Do these in addition to the moderate-intensity exercise.  Spend less time sitting. Even light physical activity can be beneficial. Other tips  Work with your health care provider to achieve or maintain a healthy weight.  Do not use any products that contain nicotine or tobacco, such as cigarettes, e-cigarettes, and chewing tobacco. If you need help quitting, ask your health care provider.  Know your numbers. Ask your health care provider to check your cholesterol and your  blood sugar (glucose). Continue to have your blood tested as directed by your health care provider. Do I need screening for cancer? Depending on your health history and family history, you may need to have cancer screening at different stages of your life. This may include screening for:  Breast cancer.  Cervical cancer.  Lung cancer.  Colorectal  cancer. What is my risk for osteoporosis? After menopause, you may be at increased risk for osteoporosis. Osteoporosis is a condition in which bone destruction happens more quickly than new bone creation. To help prevent osteoporosis or the bone fractures that can happen because of osteoporosis, you may take the following actions:  If you are 55-61 years old, get at least 1,000 mg of calcium and at least 600 mg of vitamin D per day.  If you are older than age 29 but younger than age 45, get at least 1,200 mg of calcium and at least 600 mg of vitamin D per day.  If you are older than age 104, get at least 1,200 mg of calcium and at least 800 mg of vitamin D per day. Smoking and drinking excessive alcohol increase the risk of osteoporosis. Eat foods that are rich in calcium and vitamin D, and do weight-bearing exercises several times each week as directed by your health care provider. How does menopause affect my mental health? Depression may occur at any age, but it is more common as you become older. Common symptoms of depression include:  Low or sad mood.  Changes in sleep patterns.  Changes in appetite or eating patterns.  Feeling an overall lack of motivation or enjoyment of activities that you previously enjoyed.  Frequent crying spells. Talk with your health care provider if you think that you are experiencing depression. General instructions See your health care provider for regular wellness exams and vaccines. This may include:  Scheduling regular health, dental, and eye exams.  Getting and maintaining your vaccines. These include: ? Influenza vaccine. Get this vaccine each year before the flu season begins. ? Pneumonia vaccine. ? Shingles vaccine. ? Tetanus, diphtheria, and pertussis (Tdap) booster vaccine. Your health care provider may also recommend other immunizations. Tell your health care provider if you have ever been abused or do not feel safe at  home. Summary  Menopause is a normal process in which your ability to get pregnant comes to an end.  This condition causes hot flashes, night sweats, decreased interest in sex, mood swings, headaches, or lack of sleep.  Treatment for this condition may include hormone replacement therapy.  Take actions to keep yourself healthy, including exercising regularly, eating a healthy diet, watching your weight, and checking your blood pressure and blood sugar levels.  Get screened for cancer and depression. Make sure that you are up to date with all your vaccines. This information is not intended to replace advice given to you by your health care provider. Make sure you discuss any questions you have with your health care provider. Document Revised: 09/23/2018 Document Reviewed: 09/23/2018 Elsevier Patient Education  2021 [employer].

## 2021-03-19 NOTE — Assessment & Plan Note (Signed)
Controlled. Continue amlodipine 5mg 

## 2021-03-19 NOTE — Progress Notes (Signed)
Subjective:    Patient ID: Jill Arellano, female    DOB: [birth year], 70 y.o.   MRN: [id number]  CC: Jill Arellano is a 70 y.o. female who presents today for physical exam.    HPI: Feels well today  No complaints.   HTN- compliant with amlodipine 5mg  qpm.  No cp, leg swelling.   Atherosclerosis - compliant with crestor 5mg       Colorectal Cancer Screening: due, Dr  Breast Cancer Screening: Mammogram UTD, right breast symmetry resolved  Cervical Cancer Screening: She is no longer screening for cervical cancer. She reports no h/o abnormal pap smear. No pelvic pain, abdominal bloating, vaginal bleeding.   Bone Health screening/DEXA for 65+Osteopenia 10/2020. She doesn't meet criteria for treatment as discussed 11/2020  Lung Cancer Screening: Screening annually. UTD.         Tetanus - utd      Labs: Screening labs today. Exercise: Gets regular exercise with walking.   Alcohol use:  occassioanal Smoking/tobacco use: former smoker.     HISTORY:  Past Medical History:  Diagnosis Date  . Chicken pox   . Vitamin D deficiency     Past Surgical History:  Procedure Laterality Date  . CATARACT EXTRACTION, BILATERAL  07/2019  . COLONOSCOPY WITH PROPOFOL N/A 08/09/2019   Procedure: COLONOSCOPY WITH PROPOFOL;  Surgeon: 08/2019, MD;  Location: [facility];  Service: Endoscopy;  Laterality: N/A;  . LEFT HEART CATH AND CORONARY ANGIOGRAPHY N/A 08/28/2017   Procedure: LEFT HEART CATH AND CORONARY ANGIOGRAPHY;  Surgeon: Pasty Spillers, MD;  Location: [facility];  Service: Cardiovascular;  Laterality: N/A;  . TUBAL LIGATION     Family History  Problem Relation Age of Onset  . Heart disease Mother        CHF  . Heart failure Mother   . Osteoporosis Mother   . Cancer Sister        lung  . Heart failure Brother   . Colon cancer Brother 71       colon  . Heart disease Sister        stent placement  . Breast cancer Neg Hx        ALLERGIES: Patient has no known allergies.  Current Outpatient Medications on File Prior to Visit  Medication Sig Dispense Refill  . amLODipine (NORVASC) 5 MG tablet Take 1 tablet (5 mg total) by mouth daily. 90 tablet 1  . cholecalciferol (VITAMIN D3) 10 MCG (400 UNIT) TABS tablet Take 800 Units by mouth daily.     . rosuvastatin (CRESTOR) 5 MG tablet Take 1 tablet (5 mg total) by mouth daily. 90 tablet 3   No current facility-administered medications on file prior to visit.    Social History   Tobacco Use  . Smoking status: Former Smoker    Packs/day: 2.00    Years: 44.00    Pack years: 88.00    Types: Cigarettes    Quit date: 2008    Years since quitting: 14.4  . Smokeless tobacco: Former 64    Quit date: 08/10/1995  Vaping Use  . Vaping Use: Never used  Substance Use Topics  . Alcohol use: Yes    Alcohol/week: 1.0 standard drink    Types: 1 Standard drinks or equivalent per week    Comment: beer OCC  . Drug use: No    Review of Systems  Constitutional: Negative for chills, fever and unexpected weight change.  HENT: Negative for congestion.  Respiratory: Negative for cough.   Cardiovascular: Negative for chest pain, palpitations and leg swelling.  Gastrointestinal: Negative for nausea and vomiting.  Musculoskeletal: Negative for arthralgias and myalgias.  Skin: Negative for rash.  Neurological: Negative for headaches.  Hematological: Negative for adenopathy.  Psychiatric/Behavioral: Negative for confusion.      Objective:    BP 130/66 (BP Location: Left Arm, Patient Position: Sitting, Cuff Size: Normal)   Pulse (!) 57   Temp 97.7 F (36.5 C) (Oral)   Ht 5\' 1"  (1.549 m)   Wt 128 lb 3.2 oz (58.2 kg)   SpO2 99%   BMI 24.22 kg/m   BP Readings from Last 3 Encounters:  03/19/21 130/66  01/08/21 134/64  11/27/20 (!) 150/80   Wt Readings from Last 3 Encounters:  03/19/21 128 lb 3.2 oz (58.2 kg)  01/08/21 127 lb 12.8 oz (58 kg)  11/27/20 127 lb 6.4  oz (57.8 kg)    Physical Exam Vitals reviewed.  Constitutional:      Appearance: She is well-developed.  Eyes:     Conjunctiva/sclera: Conjunctivae normal.  Neck:     Thyroid: No thyroid mass or thyromegaly.  Cardiovascular:     Rate and Rhythm: Normal rate and regular rhythm.     Pulses: Normal pulses.     Heart sounds: Normal heart sounds.  Pulmonary:     Effort: Pulmonary effort is normal.     Breath sounds: Normal breath sounds. No wheezing, rhonchi or rales.  Chest:  Breasts: Breasts are symmetrical.     Right: No inverted nipple, mass, nipple discharge, skin change or tenderness.     Left: No inverted nipple, mass, nipple discharge, skin change or tenderness.    Lymphadenopathy:     Head:     Right side of head: No submental, submandibular, tonsillar, preauricular, posterior auricular or occipital adenopathy.     Left side of head: No submental, submandibular, tonsillar, preauricular, posterior auricular or occipital adenopathy.     Cervical: No cervical adenopathy.     Right cervical: No superficial, deep or posterior cervical adenopathy.    Left cervical: No superficial, deep or posterior cervical adenopathy.  Skin:    General: Skin is warm and dry.  Neurological:     Mental Status: She is alert.  Psychiatric:        Speech: Speech normal.        Behavior: Behavior normal.        Thought Content: Thought content normal.        Assessment & Plan:   Problem List Items Addressed This Visit      Cardiovascular and Mediastinum   Atherosclerosis of aorta (HCC)    Chronic, stable. Continue crestor 5mg       HTN (hypertension)    Controlled. Continue amlodipine 5mg          Other   Routine general medical examination at a health care facility - Primary    CBE performed. Deferred pelvic exam in the absence of complaints. She will call to schedule annual CT chest and colonoscopy with Dr 11/29/20. Encouraged continued exercise.       Relevant Orders   CBC  with Differential/Platelet       I am having . [place] maintain her cholecalciferol, rosuvastatin, and amLODipine.   No orders of the defined types were placed in this encounter.   Return precautions given.   Risks, benefits, and alternatives of the medications and treatment plan prescribed today were discussed, and patient expressed understanding.  Education regarding symptom management and diagnosis given to patient on AVS.   Continue to follow with Allegra Grana, FNP for routine health maintenance.   [name] and I agreed with plan.   Rennie Plowman, FNP

## 2021-03-19 NOTE — Assessment & Plan Note (Signed)
CBE performed. Deferred pelvic exam in the absence of complaints. She will call to schedule annual CT chest and colonoscopy with Dr Maximino Greenland. Encouraged continued exercise.

## 2021-03-19 NOTE — Assessment & Plan Note (Signed)
Chronic, stable. Continue crestor 5mg 

## 2021-07-18 ENCOUNTER — Ambulatory Visit: Payer: Medicare HMO

## 2021-07-30 ENCOUNTER — Other Ambulatory Visit: Payer: Self-pay

## 2021-07-30 ENCOUNTER — Telehealth: Payer: Medicare HMO

## 2021-07-30 ENCOUNTER — Telehealth: Payer: Self-pay

## 2021-07-30 ENCOUNTER — Encounter: Payer: Self-pay | Admitting: Gastroenterology

## 2021-07-30 DIAGNOSIS — Z86010 Personal history of colonic polyps: Secondary | ICD-10-CM

## 2021-07-30 MED ORDER — NA SULFATE-K SULFATE-MG SULF 17.5-3.13-1.6 GM/177ML PO SOLN: 2.0000 | Freq: Once | ORAL | 0 refills | Status: AC

## 2021-07-30 NOTE — Progress Notes (Signed)
Gastroenterology Pre-Procedure Review  Request Date: 08/20/2021 Requesting Physician: Dr. Maximino Greenland  PATIENT REVIEW QUESTIONS: The patient responded to the following health history questions as indicated:    1. Are you having any GI issues? no 2. Do you have a personal history of Polyps? yes (removed) 3. Do you have a family history of Colon Cancer or Polyps? yes (colon cancer brother) 4. Diabetes Mellitus? no 5. Joint replacements in the past 12 months?no 6. Major health problems in the past 3 months?no 7. Any artificial heart valves, MVP, or defibrillator?no    MEDICATIONS & ALLERGIES:    Patient reports the following regarding taking any anticoagulation/antiplatelet therapy:   Plavix, Coumadin, Eliquis, Xarelto, Lovenox, Pradaxa, Brilinta, or Effient? no Aspirin? no  Patient confirms/reports the following medications:  Current Outpatient Medications  Medication Sig Dispense Refill   amLODipine (NORVASC) 5 MG tablet Take 1 tablet (5 mg total) by mouth daily. 90 tablet 1   cholecalciferol (VITAMIN D3) 10 MCG (400 UNIT) TABS tablet Take 800 Units by mouth daily.      MODERNA COVID-19 VACCINE 100 MCG/0.5ML injection      rosuvastatin (CRESTOR) 5 MG tablet Take 1 tablet (5 mg total) by mouth daily. 90 tablet 3   No current facility-administered medications for this visit.    Patient confirms/reports the following allergies:  No Known Allergies  No orders of the defined types were placed in this encounter.   AUTHORIZATION INFORMATION Primary Insurance: 1D#: Group #:  Secondary Insurance: 1D#: Group #:  SCHEDULE INFORMATION: Date: 08/20/2021 Time: Location: [facility]

## 2021-07-30 NOTE — Telephone Encounter (Signed)
Called patient to schedule for recall colonoscopy. LVM to contact office.

## 2021-08-07 ENCOUNTER — Telehealth: Payer: Self-pay

## 2021-08-07 NOTE — Telephone Encounter (Signed)
Pt. Calling to cancel colonoscopy 

## 2021-08-08 ENCOUNTER — Other Ambulatory Visit: Payer: Self-pay

## 2021-08-08 NOTE — Telephone Encounter (Signed)
Procedure r/s for 10/16/2021. Updated instructions will be sent. Pt was informed to go pick up prep from pharmacy. Pt verbalized understanding.

## 2021-08-08 NOTE — Progress Notes (Signed)
MSC was notified of procedure date and location change.

## 2021-09-18 ENCOUNTER — Other Ambulatory Visit: Payer: Self-pay | Admitting: Family

## 2021-09-24 ENCOUNTER — Ambulatory Visit (INDEPENDENT_AMBULATORY_CARE_PROVIDER_SITE_OTHER): Payer: Medicare HMO | Admitting: Family

## 2021-09-24 ENCOUNTER — Other Ambulatory Visit: Payer: Self-pay

## 2021-09-24 ENCOUNTER — Encounter: Payer: Self-pay | Admitting: Family

## 2021-09-24 VITALS — BP 135/62 | HR 65 | Temp 97.9°F | Ht 61.0 in | Wt 135.0 lb

## 2021-09-24 DIAGNOSIS — I10 Essential (primary) hypertension: Secondary | ICD-10-CM | POA: Diagnosis not present

## 2021-09-24 DIAGNOSIS — Z1231 Encounter for screening mammogram for malignant neoplasm of breast: Secondary | ICD-10-CM | POA: Diagnosis not present

## 2021-09-24 DIAGNOSIS — Z122 Encounter for screening for malignant neoplasm of respiratory organs: Secondary | ICD-10-CM | POA: Diagnosis not present

## 2021-09-24 DIAGNOSIS — I700 Atherosclerosis of aorta: Secondary | ICD-10-CM | POA: Diagnosis not present

## 2021-09-24 NOTE — Assessment & Plan Note (Signed)
Slightly elevated today.  We discussed blood pressure goal being less than 120/80.  Advised consideration for adding losartan 12.5 mg to her regimen.  Patient would  like to consider this.  She will continue amlodipine 5 mg for now

## 2021-09-24 NOTE — Progress Notes (Signed)
Subjective:    Patient ID: Jill Arellano, female    DOB: 01-Jul-1951, 70 y.o.   MRN: [id number]  CC: Jill Arellano is a 70 y.o. female who presents today for follow up.   HPI: She feels well today No new complaints  She has been thinking about revisiting cardiology just for further risk stratification in setting of known atherosclerosis. she denies any chest pain or shortness of breath.    HTN-she is compliant with amlodipine 5 mg, which she self decreased from 10 mg due to felt 'retaining fluid'.   HLD - compliant with crestor 40mg   She is due for CT chest for lung cancer screen.   Colonoscopy is scheduled 10/22/21 HISTORY:  Past Medical History:  Diagnosis Date   Arthritis    hands   Chicken pox    Hypertension    Vitamin D deficiency    Wears dentures    upper   Past Surgical History:  Procedure Laterality Date   CATARACT EXTRACTION, BILATERAL  07/2019   COLONOSCOPY WITH PROPOFOL N/A 08/09/2019   Procedure: COLONOSCOPY WITH PROPOFOL;  Surgeon: 08/11/2019, MD;  Location: [facility] ENDOSCOPY;  Service: Endoscopy;  Laterality: N/A;   LEFT HEART CATH AND CORONARY ANGIOGRAPHY N/A 08/28/2017   Procedure: LEFT HEART CATH AND CORONARY ANGIOGRAPHY;  Surgeon: 08/30/2017, MD;  Location: [facility];  Service: Cardiovascular;  Laterality: N/A;   TUBAL LIGATION     Family History  Problem Relation Age of Onset   Heart disease Mother        CHF   Heart failure Mother    Osteoporosis Mother    Cancer Sister        lung   Heart failure Brother    Colon cancer Brother 71       colon   Heart disease Sister        stent placement   Breast cancer Neg Hx     Allergies: Patient has no known allergies. Current Outpatient Medications on File Prior to Visit  Medication Sig Dispense Refill   amLODipine (NORVASC) 5 MG tablet Take 1 tablet (5 mg total) by mouth daily. 90 tablet 1   APPLE CIDER VINEGAR PO Take by mouth daily.     Calcium Carbonate (CALCIUM  600 PO) Take by mouth daily at 12 noon.     cholecalciferol (VITAMIN D3) 10 MCG (400 UNIT) TABS tablet Take 800 Units by mouth daily.      Omega-3 Fatty Acids (FISH OIL) 1000 MG CAPS Take by mouth daily.     rosuvastatin (CRESTOR) 5 MG tablet TAKE 1 TABLET BY MOUTH ONCE A DAY 90 tablet 3   MODERNA COVID-19 VACCINE 100 MCG/0.5ML injection  (Patient not taking: Reported on 09/24/2021)     No current facility-administered medications on file prior to visit.    Social History   Tobacco Use   Smoking status: Former    Packs/day: 2.00    Years: 44.00    Pack years: 88.00    Types: Cigarettes    Quit date: 2008    Years since quitting: 14.9   Smokeless tobacco: Former    Quit date: 08/10/1995  Vaping Use   Vaping Use: Never used  Substance Use Topics   Alcohol use: Yes    Alcohol/week: 1.0 standard drink    Types: 1 Standard drinks or equivalent per week    Comment: beer OCC   Drug use: No    Review of Systems  Constitutional:  Negative for chills and fever.  Respiratory:  Negative for cough.   Cardiovascular:  Negative for chest pain and palpitations.  Gastrointestinal:  Negative for nausea and vomiting.     Objective:    BP 135/62   Pulse 65   Temp 97.9 F (36.6 C) (Temporal)   Ht 5\' 1"  (1.549 m)   Wt 135 lb (61.2 kg)   SpO2 98%   BMI 25.51 kg/m  BP Readings from Last 3 Encounters:  09/24/21 135/62  03/19/21 130/66  01/08/21 134/64   Wt Readings from Last 3 Encounters:  09/24/21 135 lb (61.2 kg)  03/19/21 128 lb 3.2 oz (58.2 kg)  01/08/21 127 lb 12.8 oz (58 kg)    Physical Exam Vitals reviewed.  Constitutional:      Appearance: She is well-developed.  Eyes:     Conjunctiva/sclera: Conjunctivae normal.  Cardiovascular:     Rate and Rhythm: Normal rate and regular rhythm.     Pulses: Normal pulses.     Heart sounds: Normal heart sounds.  Pulmonary:     Effort: Pulmonary effort is normal.     Breath sounds: Normal breath sounds. No wheezing, rhonchi or  rales.  Skin:    General: Skin is warm and dry.  Neurological:     Mental Status: She is alert.  Psychiatric:        Speech: Speech normal.        Behavior: Behavior normal.        Thought Content: Thought content normal.       Assessment & Plan:   Problem List Items Addressed This Visit       Cardiovascular and Mediastinum   Atherosclerosis of aorta (HCC)    Symptomatically stable.  LDL cholesterol less than 01/10/21.  Continue Crestor 40 mg.  Agree with patient that discussion with cardiology specifically around CT calcium score and if this is appropriate for her is advisable. She would like to be more proactive which I certainly understand.  I placed a referral back to cardiology for further discussion.      Relevant Orders   Ambulatory referral to Cardiology   HTN (hypertension)    Slightly elevated today.  We discussed blood pressure goal being less than 120/80.  Advised consideration for adding losartan 12.5 mg to her regimen.  Patient would  like to consider this.  She will continue amlodipine 5 mg for now        Other   Screening for breast cancer - Primary   Relevant Orders   MM 3D SCREEN BREAST BILATERAL   Other Visit Diagnoses     Screening for lung cancer       Relevant Orders   Ambulatory Referral for Lung Cancer Scre        I am having 875. Hardt maintain her cholecalciferol, amLODipine, Moderna COVID-19 Vaccine, APPLE CIDER VINEGAR PO, Fish Oil, rosuvastatin, and Calcium Carbonate (CALCIUM 600 PO).   No orders of the defined types were placed in this encounter.   Return precautions given.   Risks, benefits, and alternatives of the medications and treatment plan prescribed today were discussed, and patient expressed understanding.   Education regarding symptom management and diagnosis given to patient on AVS.  Continue to follow with Anette Riedel, FNP for routine health maintenance.   Allegra Grana and I agreed with plan.   [name], FNP

## 2021-09-24 NOTE — Patient Instructions (Addendum)
Blood pressure above goal of 120/80. We can consider addition of losartan 12.5mg  with amlodipine 5mg .   Consider CT calcium scoring and discuss this with Dr .   CT chest lung cancer screening scoring  Let Kirke Corin know if you dont hear back within a week in regards to an appointment being scheduled.    Please call  and schedule your 3D mammogram as we discussed.   [facility]  [address]  [place], [place]  [place]

## 2021-09-24 NOTE — Assessment & Plan Note (Signed)
Symptomatically stable.  LDL cholesterol less than 572.  Continue Crestor 40 mg.  Agree with patient that discussion with cardiology specifically around CT calcium score and if this is appropriate for her is advisable. She would like to be more proactive which I certainly understand.  I placed a referral back to cardiology for further discussion.

## 2021-10-22 ENCOUNTER — Ambulatory Visit
Admission: RE | Admit: 2021-10-22 | Discharge: 2021-10-22 | Disposition: A | Discharge status: Home / Self Care | Payer: Medicare HMO | Attending: Gastroenterology | Admitting: Gastroenterology

## 2021-10-22 ENCOUNTER — Ambulatory Visit: Payer: Medicare HMO | Admitting: Anesthesiology

## 2021-10-22 ENCOUNTER — Encounter
Admission: RE | Disposition: A | Discharge status: Home / Self Care | Payer: Self-pay | Source: Home / Self Care | Attending: Gastroenterology

## 2021-10-22 ENCOUNTER — Encounter: Payer: Self-pay | Admitting: Gastroenterology

## 2021-10-22 DIAGNOSIS — Z860100 Personal history of colon polyps, unspecified: Secondary | ICD-10-CM

## 2021-10-22 DIAGNOSIS — K5730 Diverticulosis of large intestine without perforation or abscess without bleeding: Secondary | ICD-10-CM | POA: Diagnosis not present

## 2021-10-22 DIAGNOSIS — Z87891 Personal history of nicotine dependence: Secondary | ICD-10-CM | POA: Insufficient documentation

## 2021-10-22 DIAGNOSIS — Z1211 Encounter for screening for malignant neoplasm of colon: Secondary | ICD-10-CM | POA: Diagnosis not present

## 2021-10-22 HISTORY — PX: COLONOSCOPY WITH PROPOFOL: SHX5780

## 2021-10-22 HISTORY — DX: Presence of dental prosthetic device (complete) (partial): Z97.2

## 2021-10-22 HISTORY — DX: Unspecified osteoarthritis, unspecified site: M19.90

## 2021-10-22 HISTORY — DX: Essential (primary) hypertension: I10

## 2021-10-22 SURGERY — COLONOSCOPY WITH PROPOFOL
Anesthesia: General

## 2021-10-22 MED ORDER — SODIUM CHLORIDE 0.9 % IV SOLN: INTRAVENOUS | Status: DC

## 2021-10-22 MED ORDER — PROPOFOL 10 MG/ML IV BOLUS
INTRAVENOUS | Status: DC | PRN
  Administered 2021-10-22 (×2): 30 mg via INTRAVENOUS
  Administered 2021-10-22: 70 mg via INTRAVENOUS

## 2021-10-22 MED ORDER — PROPOFOL 500 MG/50ML IV EMUL
INTRAVENOUS | Status: DC | PRN
  Administered 2021-10-22: 120 ug/kg/min via INTRAVENOUS

## 2021-10-22 MED ORDER — LIDOCAINE 2% (20 MG/ML) 5 ML SYRINGE
INTRAMUSCULAR | Status: DC | PRN
  Administered 2021-10-22: 20 mg via INTRAVENOUS

## 2021-10-22 MED ORDER — STERILE WATER FOR IRRIGATION IR SOLN
Status: DC | PRN
  Administered 2021-10-22: 60 mL

## 2021-10-22 NOTE — Transfer of Care (Signed)
Immediate Anesthesia Transfer of Care Note  Patient: Jill Arellano  Procedure(s) Performed: COLONOSCOPY WITH PROPOFOL  Patient Location: Endoscopy Unit  Anesthesia Type:General  Level of Consciousness: awake, alert  and oriented  Airway & Oxygen Therapy: Patient Spontanous Breathing  Post-op Assessment: Report given to RN and Post -op Vital signs reviewed and stable  Post vital signs: Reviewed  Last Vitals:  Vitals Value Taken Time  BP 115/65 10/22/21 1001  Temp 36.2 C 10/22/21 1000  Pulse 89 10/22/21 1002  Resp 14 10/22/21 1002  SpO2 98 % 10/22/21 1002  Vitals shown include unvalidated device data.  Last Pain:  Vitals:   10/22/21 1000  TempSrc: Tympanic  PainSc: Asleep         Complications: No notable events documented.

## 2021-10-22 NOTE — Anesthesia Postprocedure Evaluation (Signed)
Anesthesia Post Note  Patient: Jill Arellano  Procedure(s) Performed: COLONOSCOPY WITH PROPOFOL  Patient location during evaluation: Endoscopy Anesthesia Type: General Level of consciousness: awake and alert Pain management: pain level controlled Vital Signs Assessment: post-procedure vital signs reviewed and stable Respiratory status: spontaneous breathing, nonlabored ventilation, respiratory function stable and patient connected to nasal cannula oxygen Cardiovascular status: blood pressure returned to baseline and stable Postop Assessment: no apparent nausea or vomiting Anesthetic complications: no   No notable events documented.   Last Vitals:  Vitals:   10/22/21 1020 10/22/21 1030  BP: (!) 160/59 (!) 145/76  Pulse: 62 71  Resp: 17 16  Temp:    SpO2: 99% 99%    Last Pain:  Vitals:   10/22/21 1030  TempSrc:   PainSc: 0-No pain                 Cleda Mccreedy Aundre Hietala

## 2021-10-22 NOTE — H&P (Addendum)
Jill Darby, MD [address]  [place]  [place], [place] [address]  Main: [phone]  Fax: [phone] Pager: [phone]  Primary Care Physician:  Burnard Hawthorne, FNP Primary Gastroenterologist:  Dr. Cephas Arellano  Pre-Procedure History & Physical: HPI:  Jill Arellano is a 71 y.o. female is here for an colonoscopy.   Past Medical History:  Diagnosis Date   Arthritis    hands   Chicken pox    Hypertension    Vitamin D deficiency    Wears dentures    upper    Past Surgical History:  Procedure Laterality Date   CATARACT EXTRACTION, BILATERAL  07/2019   COLONOSCOPY WITH PROPOFOL N/A 08/09/2019   Procedure: COLONOSCOPY WITH PROPOFOL;  Surgeon: Virgel Manifold, MD;  Location: [facility] ENDOSCOPY;  Service: Endoscopy;  Laterality: N/A;   LEFT HEART CATH AND CORONARY ANGIOGRAPHY N/A 08/28/2017   Procedure: LEFT HEART CATH AND CORONARY ANGIOGRAPHY;  Surgeon: Lorretta Harp, MD;  Location: [facility];  Service: Cardiovascular;  Laterality: N/A;   TUBAL LIGATION      Prior to Admission medications   Medication Sig Start Date End Date Taking? Authorizing Provider  APPLE CIDER VINEGAR PO Take by mouth daily.   Yes [provider]  Omega-3 Fatty Acids (FISH OIL) 1000 MG CAPS Take by mouth daily.   Yes [provider]  amLODipine (NORVASC) 5 MG tablet Take 1 tablet (5 mg total) by mouth daily. 01/08/21   Burnard Hawthorne, FNP  Calcium Carbonate (CALCIUM 600 PO) Take by mouth daily at 12 noon.    [provider]  cholecalciferol (VITAMIN D3) 10 MCG (400 UNIT) TABS tablet Take 800 Units by mouth daily.     [provider]  MODERNA COVID-19 VACCINE 100 MCG/0.5ML injection  02/05/21   [provider]  rosuvastatin (CRESTOR) 5 MG tablet TAKE 1 TABLET BY MOUTH ONCE A DAY 09/18/21   Burnard Hawthorne, FNP    Allergies as of 07/30/2021   (No Known Allergies)    Family History  Problem Relation Age of Onset    Heart disease Mother        CHF   Heart failure Mother    Osteoporosis Mother    Cancer Sister        lung   Heart failure Brother    Colon cancer Brother 47       colon   Heart disease Sister        stent placement   Breast cancer Neg Hx     Social History   Socioeconomic History   Marital status: Single    Spouse name: Not on file   Number of children: Not on file   Years of education: Not on file   Highest education level: Not on file  Occupational History   Not on file  Tobacco Use   Smoking status: Former    Packs/day: 2.00    Years: 44.00    Pack years: 88.00    Types: Cigarettes    Quit date: 2008    Years since quitting: 15.0   Smokeless tobacco: Former    Quit date: 08/10/1995  Vaping Use   Vaping Use: Never used  Substance and Sexual Activity   Alcohol use: Yes    Alcohol/week: 1.0 standard drink    Types: 1 Standard drinks or equivalent per week    Comment: beer OCC   Drug use: No   Sexual activity: Never  Other Topics  Concern   Not on file  Social History Narrative   Moved from [place] in 2013    Lives by herself   Pets: 2 dogs live inside   Children: 2, daughter (74) and son (70)    Book Therapist, 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    Works at [employer] 20-30 hrs a week for extra [employer]    Enjoys reading    Social Determinants of Radio broadcast assistant Strain: Not on [employer] Insecurity: Not on file  Transportation Needs: Not on file  Physical Activity: Not on file  Stress: Not on file  Social Connections: Not on file  Intimate Partner Violence: Not on file    Review of Systems: See HPI, otherwise negative ROS  Physical Exam: BP (!) 150/77    Pulse (!) 102    Temp (!) 97.3 F (36.3 C) (Temporal)    Resp 18    Ht 5\' 1"  (1.549 m)    Wt 61.2 kg    SpO2 98%    BMI 25.51 kg/m  General:   Alert,  pleasant and cooperative in NAD Head:  Normocephalic and atraumatic. Neck:  Supple; no masses or thyromegaly. Lungs:  Clear throughout to  auscultation.    Heart:  Regular rate and rhythm. Abdomen:  Soft, nontender and nondistended. Normal bowel sounds, without guarding, and without rebound.   Neurologic:  Alert and  oriented x4;  grossly normal neurologically.  Impression/Plan: Jill Arellano is here for an colonoscopy to be performed for h/o colon polyps, fair prep  Risks, benefits, limitations, and alternatives regarding  colonoscopy have been reviewed with the patient.  Questions have been answered.  All parties agreeable.   Sherri Sear, MD  10/22/2021, 9:31 AM

## 2021-10-22 NOTE — Op Note (Signed)
[facility] Gastroenterology Patient Name: Jill Arellano Procedure Date: 10/22/2021 9:28 AM MRN: [id number] Account #: [id number] Date of Birth: 18-Dec-1950 Admit Type: Outpatient Age: 71 Room: [facility] ENDO ROOM 1 Gender: Female Note Status: Finalized Instrument Name: Park Meo [id number] Procedure:             Colonoscopy Indications:           Screening for colorectal malignant neoplasm, Screening                         for colorectal malignant neoplasm, inadequate bowel                         prep on last colonoscopy (more recent than 10 years                         ago), Last colonoscopy: October 2020 Providers:             Lin Landsman MD, MD Referring MD:          Yvetta Coder. Arnett (Referring MD) Medicines:             General Anesthesia Complications:         No immediate complications. Estimated blood loss: None. Procedure:             Pre-Anesthesia Assessment:                        - Prior to the procedure, a History and Physical was                         performed, and patient medications and allergies were                         reviewed. The patient is competent. The risks and                         benefits of the procedure and the sedation options and                         risks were discussed with the patient. All questions                         were answered and informed consent was obtained.                         Patient identification and proposed procedure were                         verified by the physician, the nurse, the                         anesthesiologist, the anesthetist and the technician                         in the pre-procedure area in the procedure room in the                         endoscopy suite. Mental Status Examination: alert and  oriented. Airway Examination: normal oropharyngeal                         airway and neck mobility. Respiratory Examination:                          clear to auscultation. CV Examination: normal.                         Prophylactic Antibiotics: The patient does not require                         prophylactic antibiotics. Prior Anticoagulants: The                         patient has taken no previous anticoagulant or                         antiplatelet agents. ASA Grade Assessment: III - A                         patient with severe systemic disease. After reviewing                         the risks and benefits, the patient was deemed in                         satisfactory condition to undergo the procedure. The                         anesthesia plan was to use general anesthesia.                         Immediately prior to administration of medications,                         the patient was re-assessed for adequacy to receive                         sedatives. The heart rate, respiratory rate, oxygen                         saturations, blood pressure, adequacy of pulmonary                         ventilation, and response to care were monitored                         throughout the procedure. The physical status of the                         patient was re-assessed after the procedure.                        After obtaining informed consent, the colonoscope was                         passed under direct vision. Throughout the procedure,  the patient's blood pressure, pulse, and oxygen                         saturations were monitored continuously. The                         Colonoscope was introduced through the anus and                         advanced to the the cecum, identified by appendiceal                         orifice and ileocecal valve. The colonoscopy was                         performed without difficulty. The patient tolerated                         the procedure well. The quality of the bowel                         preparation was evaluated using the BBPS [facility] Bowel                          Preparation Scale) with scores of: Right Colon = 3,                         Transverse Colon = 3 and Left Colon = 3 (entire mucosa                         seen well with no residual staining, small fragments                         of stool or opaque liquid). The total BBPS score                         equals 9. Findings:      The perianal and digital rectal examinations were normal. Pertinent       negatives include normal sphincter tone and no palpable rectal lesions.      The entire examined colon appeared normal.      Multiple diverticula were found in the recto-sigmoid colon and sigmoid       colon.      The retroflexed view of the distal rectum and anal verge was normal and       showed no anal or rectal abnormalities. Impression:            - The entire examined colon is normal.                        - Diverticulosis in the recto-sigmoid colon and in the                         sigmoid colon.                        - The distal rectum and anal verge are normal on  retroflexion view.                        - No specimens collected. Recommendation:        - Discharge patient to home (with escort).                        - Resume previous diet today.                        - Continue present medications.                        - Repeat colonoscopy in 10 years for screening                         purposes based on patient's condition at that age. Procedure Code(s):     --- Professional ---                        [id number], Colorectal cancer screening; colonoscopy on                         individual not meeting criteria for high risk Diagnosis Code(s):     --- Professional ---                        Z12.11, Encounter for screening for malignant neoplasm                         of colon                        K57.30, Diverticulosis of large intestine without                         perforation or abscess without bleeding CPT copyright 2019 American  Medical Association. All rights reserved. The codes documented in this report are preliminary and upon coder review may  be revised to meet current compliance requirements. Dr. Ulyess Mort Lin Landsman MD, MD 10/22/2021 9:59:50 AM This report has been signed electronically. Number of Addenda: 0 Note Initiated On: 10/22/2021 9:28 AM Scope Withdrawal Time: 0 hours 12 minutes 6 seconds  Total Procedure Duration: 0 hours 15 minutes 48 seconds  Estimated Blood Loss:  Estimated blood loss: none.      [facility]

## 2021-10-22 NOTE — Anesthesia Preprocedure Evaluation (Signed)
Anesthesia Evaluation  Patient identified by MRN, date of birth, ID band Patient awake    Reviewed: Allergy & Precautions, NPO status , Patient's Chart, lab work & pertinent test results  History of Anesthesia Complications Negative for: history of anesthetic complications  Airway Mallampati: III  TM Distance: <3 FB Neck ROM: full    Dental  (+) Chipped, Upper Dentures   Pulmonary neg shortness of breath, former smoker,    Pulmonary exam normal        Cardiovascular Exercise Tolerance: Good hypertension, (-) angina+ CAD  Normal cardiovascular exam     Neuro/Psych  Headaches, negative psych ROS   GI/Hepatic negative GI ROS, Neg liver ROS, neg GERD  ,  Endo/Other  negative endocrine ROS  Renal/GU negative Renal ROS  negative genitourinary   Musculoskeletal  (+) Arthritis ,   Abdominal   Peds  Hematology negative hematology ROS (+)   Anesthesia Other Findings Past Medical History: No date: Arthritis     Comment:  hands No date: Chicken pox No date: Hypertension No date: Vitamin D deficiency No date: Wears dentures     Comment:  upper  Past Surgical History: 07/2019: CATARACT EXTRACTION, BILATERAL 08/09/2019: COLONOSCOPY WITH PROPOFOL; N/A     Comment:  Procedure: COLONOSCOPY WITH PROPOFOL;  Surgeon:               Virgel Manifold, MD;  Location: [facility] ENDOSCOPY;                Service: Endoscopy;  Laterality: N/A; 08/28/2017: LEFT HEART CATH AND CORONARY ANGIOGRAPHY; N/A     Comment:  Procedure: LEFT HEART CATH AND CORONARY ANGIOGRAPHY;                Surgeon: Lorretta Harp, MD;  Location: [facility];  Service: Cardiovascular;  Laterality: N/A; No date: TUBAL LIGATION  BMI    Body Mass Index: 25.51 kg/m      Reproductive/Obstetrics negative OB ROS                             Anesthesia Physical Anesthesia Plan  ASA: 3  Anesthesia Plan:  General   Post-op Pain Management:    Induction: Intravenous  PONV Risk Score and Plan: Propofol infusion and TIVA  Airway Management Planned: Natural Airway and Nasal Cannula  Additional Equipment:   Intra-op Plan:   Post-operative Plan:   Informed Consent: I have reviewed the patients History and Physical, chart, labs and discussed the procedure including the risks, benefits and alternatives for the proposed anesthesia with the patient or authorized representative who has indicated his/her understanding and acceptance.     Dental Advisory Given  Plan Discussed with: Anesthesiologist, CRNA and Surgeon  Anesthesia Plan Comments: (Patient consented for risks of anesthesia including but not limited to:  - adverse reactions to medications - risk of airway placement if required - damage to eyes, teeth, lips or other oral mucosa - nerve damage due to positioning  - sore throat or hoarseness - Damage to heart, brain, nerves, lungs, other parts of body or loss of life  Patient voiced understanding.)        Anesthesia Quick Evaluation

## 2021-11-22 ENCOUNTER — Telehealth: Payer: Self-pay | Admitting: Acute Care

## 2021-11-22 NOTE — Telephone Encounter (Signed)
Unable to reach - left voicemail with call back number to schedule LDCT

## 2021-11-23 ENCOUNTER — Other Ambulatory Visit: Payer: Self-pay

## 2021-11-23 DIAGNOSIS — Z87891 Personal history of nicotine dependence: Secondary | ICD-10-CM

## 2021-11-26 ENCOUNTER — Other Ambulatory Visit: Payer: Self-pay

## 2021-11-26 ENCOUNTER — Ambulatory Visit
Admission: RE | Admit: 2021-11-26 | Discharge: 2021-11-26 | Disposition: A | Discharge status: Home / Self Care | Payer: Medicare HMO | Source: Ambulatory Visit | Attending: Family | Admitting: Family

## 2021-11-26 ENCOUNTER — Ambulatory Visit: Payer: Medicare HMO | Admitting: Nurse Practitioner

## 2021-11-26 ENCOUNTER — Encounter: Payer: Self-pay | Admitting: Nurse Practitioner

## 2021-11-26 VITALS — BP 160/66 | HR 68 | Ht 61.0 in | Wt 137.8 lb

## 2021-11-26 DIAGNOSIS — E782 Mixed hyperlipidemia: Secondary | ICD-10-CM

## 2021-11-26 DIAGNOSIS — I2510 Atherosclerotic heart disease of native coronary artery without angina pectoris: Secondary | ICD-10-CM | POA: Diagnosis not present

## 2021-11-26 DIAGNOSIS — Z1231 Encounter for screening mammogram for malignant neoplasm of breast: Secondary | ICD-10-CM | POA: Insufficient documentation

## 2021-11-26 DIAGNOSIS — I10 Essential (primary) hypertension: Secondary | ICD-10-CM | POA: Diagnosis not present

## 2021-11-26 MED ORDER — AMLODIPINE BESYLATE 10 MG PO TABS: 10.0000 mg | ORAL_TABLET | Freq: Every day | ORAL | 3 refills | Status: DC

## 2021-11-26 NOTE — Progress Notes (Signed)
Cardiology Clinic Note   Patient Name: Jill Arellano Date of Encounter: 11/26/2021  Primary Care Provider:  Burnard Hawthorne, FNP Primary Cardiologist:  Jill Sacramento, MD  Patient Profile    71 year old female with a history of remote tobacco abuse, hypertension, hyperlipidemia, coronary calcifications with normal coronary arteries, and pulmonary nodules, who presents for follow-up related to HTN.  Past Medical History    Past Medical History:  Diagnosis Date   Arthritis    hands   Chicken pox    Coronary artery calcification seen on CT scan    a. 05/2017 Cor Ca2+ noted on CT; b. 07/2017 ETT: 22mm inflat ST dep w/ exercise (5:22, HTN response); c. 08/2017 Cath: nl cors. EF 65%.   Diverticulosis    a. 10/2021 Colonoscopy - diverticulosis.   Hypertension    Vitamin D deficiency    Wears dentures    upper   Past Surgical History:  Procedure Laterality Date   CATARACT EXTRACTION, BILATERAL  07/2019   COLONOSCOPY WITH PROPOFOL N/A 08/09/2019   Procedure: COLONOSCOPY WITH PROPOFOL;  Surgeon: Jill Manifold, MD;  Location: [facility] ENDOSCOPY;  Service: Endoscopy;  Laterality: N/A;   COLONOSCOPY WITH PROPOFOL N/A 10/22/2021   Procedure: COLONOSCOPY WITH PROPOFOL;  Surgeon: Jill Landsman, MD;  Location: [facility] ENDOSCOPY;  Service: Endoscopy;  Laterality: N/A;   LEFT HEART CATH AND CORONARY ANGIOGRAPHY N/A 08/28/2017   Procedure: LEFT HEART CATH AND CORONARY ANGIOGRAPHY;  Surgeon: Jill Harp, MD;  Location: [facility];  Service: Cardiovascular;  Laterality: N/A;   TUBAL LIGATION      Allergies  No Known Allergies  History of Present Illness    71 year old female with above past medical history including remote tobacco abuse, hypertension, hyperlipidemia, coronary calcifications, and pulmonary nodules.  She was previously evaluated in cardiology clinic in October 2018 in the setting of coronary calcifications noted on surveillance CT scanning for pulm  pulmonary nodules.  She reported mild exertional dyspnea at the time without any evidence of chest pain.  She underwent exercise treadmill testing, which was abnormal with 1 mm inferolateral ST segment depression with exercise.  She subsequently underwent diagnostic catheterization which showed normal coronary arteries and normal LV function.  [name] was last seen in cardiology clinic in November 2018.  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  She says she is on the go all day long, most days of the week and really enjoys her life.  She does not experience chest pain, dyspnea, palpitations, PND, orthopnea, dizziness, syncope, edema, or early satiety.  Her blood pressure is elevated today.  She does not routinely check this at home but does own a cuff.  She notes that she is typically in the 130s+ at provider visits.  Home Medications    Current Outpatient Medications  Medication Sig Dispense Refill   APPLE CIDER VINEGAR PO Take by mouth daily.     Calcium Carbonate (CALCIUM 600 PO) Take by mouth daily at 12 noon.     cholecalciferol (VITAMIN D3) 10 MCG (400 UNIT) TABS tablet Take 800 Units by mouth daily.      Omega-3 Fatty Acids (FISH OIL) 1000 MG CAPS Take by mouth daily.     rosuvastatin (CRESTOR) 5 MG tablet TAKE 1 TABLET BY MOUTH ONCE A DAY 90 tablet 3   amLODipine (NORVASC) 10 MG tablet Take 1 tablet (  10 mg total) by mouth daily. 90 tablet 3   No current facility-administered medications for this visit.     Family History    Family History  Problem Relation Age of Onset   Heart disease Mother        CHF   Heart failure Mother    Osteoporosis Mother    Cancer Sister        lung   Heart failure Brother    Colon cancer Brother 72       colon   Heart disease Sister        stent placement   Breast cancer Neg Hx    She indicated that her mother is deceased. She  indicated that her father is deceased. She indicated that only one of her two sisters is alive. She indicated that her brother is deceased. She indicated that her daughter is alive. She indicated that her son is alive. She indicated that the status of her neg hx is unknown.  Social History    Social History   Socioeconomic History   Marital status: Single    Spouse name: Not on file   Number of children: Not on file   Years of education: Not on file   Highest education level: Not on file  Occupational History   Not on file  Tobacco Use   Smoking status: Former    Packs/day: 2.00    Years: 44.00    Pack years: 88.00    Types: Cigarettes    Quit date: 2008    Years since quitting: 15.1   Smokeless tobacco: Former    Quit date: 08/10/1995  Vaping Use   Vaping Use: Never used  Substance and Sexual Activity   Alcohol use: Yes    Alcohol/week: 1.0 standard drink    Types: 1 Standard drinks or equivalent per week    Comment: beer OCC   Drug use: No   Sexual activity: Never  Other Topics Concern   Not on file  Social History Narrative   Moved from [place] in 2013    Lives by herself   Pets: 2 dogs live inside   Children: 2, daughter (56) and son (18)    Book Therapist, 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  Cardiovascular:  No chest pain, dyspnea on exertion, edema, orthopnea, palpitations, paroxysmal nocturnal dyspnea. Dermatological: No rash, lesions/masses Respiratory: No cough, dyspnea Urologic: No hematuria, dysuria Abdominal:   No nausea, vomiting, diarrhea,  bright red blood per rectum, melena, or hematemesis Neurologic:  No visual changes, wkns, changes in mental status. All other systems reviewed and are otherwise negative except as noted above.     Physical Exam    VS:  BP (!) 160/66    Pulse 68    Ht 5\' 1"  (1.549 m)    Wt 137 lb 12.8 oz (62.5 kg)    SpO2 96%    BMI 26.04 kg/m  , BMI Body mass index is 26.04 kg/m.  GEN: Well nourished, well developed, in no acute distress. HEENT: normal. Neck: Supple, no JVD, carotid bruits, or masses. Cardiac: RRR, no murmurs, rubs, or gallops. No clubbing, cyanosis, edema.  Radials/PT 2+ and equal bilaterally.  Respiratory:  Respirations regular and unlabored, clear to auscultation bilaterally. GI: Soft, nontender, nondistended, BS + x 4. MS: no deformity or atrophy. Skin: warm and dry, no rash. Neuro:  Strength and sensation are intact. Psych: Normal affect.  Accessory Clinical Findings    ECG personally reviewed by me today-regular sinus rhythm, 68 - No acute changes  Lab Results  Component Value Date   WBC 7.2 03/19/2021   HGB 14.1 03/19/2021   HCT 41.8 03/19/2021   MCV 91.4 03/19/2021   PLT 246.0 03/19/2021   Lab Results  Component Value Date   CREATININE 0.70 11/06/2020   BUN 24 (H) 11/06/2020   NA 136 11/06/2020   K 4.8 11/06/2020   CL 101 11/06/2020   CO2 27 11/06/2020   Lab Results  Component Value Date   ALT 17 11/06/2020   AST 15 11/06/2020   ALKPHOS 56 11/06/2020   BILITOT 0.5 11/06/2020   Lab Results  Component Value Date   CHOL 230 (H) 01/08/2021   HDL 128.10 01/08/2021   LDLCALC 90 01/08/2021   LDLDIRECT 113.0 07/26/2019   TRIG 62.0 01/08/2021   CHOLHDL 2 01/08/2021    Lab Results  Component Value Date   HGBA1C 5.4 07/26/2019    Assessment & Plan   1.  Essential hypertension: Blood pressure elevated today at 160/66.  She notes that we reviewed historical blood pressures and note that she is currently in the 130s and above at provider visits.  She is  interested in risk factor modifications evaluation and modifications.  She is very active at home and completely asymptomatic.  I am increasing amlodipine to 10 mg daily and she will start checking her blood pressure at home.  2.  Coronary calcium noted on CT: This dates back to 2018, at which time she underwent exercise treadmill testing which was abnormal, prompting diagnostic catheterization which showed normal coronary arteries.  Since then, she has been on statin therapy and has tolerated this well.  She is very active, exercising regularly/walking her dogs, and working full-time as a Educational psychologist.  She does not experience chest pain, dyspnea, or limitations in her activity.  We discussed risk factor modification today.  In the absence of symptoms, I do not think that repeat stress testing or coronary CT angiogram are necessary at this time.  As she she is known to have coronary calcium on chest CTs, I do not think that coronary calcium scoring will further change our recommendations for therapy given that she is already on a statin.  Encouraged her to continue exercising regularly and if she develops any change in activity tolerance or symptoms such as chest pain or dyspnea, at that point, we can consider noninvasive ischemic/anatomic testing.  3.  Hyperlipidemia: On statin therapy with an LDL of 90 March 2022.  Encouraged ongoing exercise and weight management.  4.  Pulmonary nodules: Followed with annual chest CTs.  5.  Disposition: Follow-up in clinic in 1 year or sooner if necessary.  Patient will contact us with blood pressure readings in approximately 2 weeks.  Murray Hodgkins, NP 11/26/2021, 6:19 PM

## 2021-11-26 NOTE — Patient Instructions (Signed)
Medication Instructions:  Your physician has recommended you make the following change in your medication:   INCREASE Amlodipine to 10 mg daily. An Rx has been sent to your pharmacy.  *If you need a refill on your cardiac medications before your next appointment, please call your pharmacy*   Lab Work: None ordered If you have labs (blood work) drawn today and your tests are completely normal, you will receive your results only by: MyChart Message (if you have MyChart) OR A paper copy in the mail If you have any lab test that is abnormal or we need to change your treatment, we will call you to review the results.   Testing/Procedures: None ordered   Follow-Up: At [facility], you and your health needs are our priority.  As part of our continuing mission to provide you with exceptional heart care, we have created designated Provider Care Teams.  These Care Teams include your primary Cardiologist (physician) and Advanced Practice Providers (APPs -  Physician Assistants and Nurse Practitioners) who all work together to provide you with the care you need, when you need it.  We recommend signing up for the patient portal called "MyChart".  Sign up information is provided on this After Visit Summary.  MyChart is used to connect with patients for Virtual Visits (Telemedicine).  Patients are able to view lab/test results, encounter notes, upcoming appointments, etc.  Non-urgent messages can be sent to your provider as well.   To learn more about what you can do with MyChart, go to [email or web address].    Your next appointment:   Your physician wants you to follow-up in: 1 year You will receive a reminder letter in the mail two months in advance. If you don't receive a letter, please call our office to schedule the follow-up appointment.   The format for your next appointment:   In Person  Provider:   You may see Lorine Bears, MD or one of the following Advanced Practice Providers on  your designated Care Team:   Nicolasa Ducking, NP Eula Listen, PA-C Cadence Fransico Michael, PA-C{    Other Instructions N/A

## 2021-12-03 ENCOUNTER — Other Ambulatory Visit: Payer: Self-pay

## 2021-12-03 ENCOUNTER — Ambulatory Visit
Admission: RE | Admit: 2021-12-03 | Discharge: 2021-12-03 | Disposition: A | Discharge status: Home / Self Care | Payer: Medicare HMO | Source: Ambulatory Visit | Attending: Acute Care | Admitting: Acute Care

## 2021-12-03 DIAGNOSIS — Z87891 Personal history of nicotine dependence: Secondary | ICD-10-CM | POA: Insufficient documentation

## 2021-12-06 ENCOUNTER — Telehealth: Payer: Self-pay | Admitting: Acute Care

## 2021-12-06 NOTE — Telephone Encounter (Signed)
Left voicemail for patient to call back [id number] results.  Patient is 15 years non-smoker according to our records, which would make her ineligible for future LCS CT.  Result of CT with notation of ending LCS scans has been routed to PCP.

## 2021-12-10 ENCOUNTER — Telehealth: Payer: Self-pay | Admitting: Family

## 2021-12-10 DIAGNOSIS — Z87891 Personal history of nicotine dependence: Secondary | ICD-10-CM | POA: Insufficient documentation

## 2021-12-10 DIAGNOSIS — Z0100 Encounter for examination of eyes and vision without abnormal findings: Secondary | ICD-10-CM | POA: Diagnosis not present

## 2021-12-10 DIAGNOSIS — I10 Essential (primary) hypertension: Secondary | ICD-10-CM | POA: Diagnosis not present

## 2021-12-10 NOTE — Telephone Encounter (Signed)
-----   Message from Karlton Lemon, RN sent at 12/06/2021  9:14 AM EST ----- Our records indicate patient is 15 years as non-smoker which would make her ineligible for further LCS scans.  She may have a CT wo contrast ordered by PCP, if recommended as a part of her ongoing care, just isn't eligible for the LCS CT.  We will call the patient today to inform her that she will not be scheduled with [place] for next year.

## 2021-12-10 NOTE — Telephone Encounter (Signed)
Call patient I received note from pulmonology as it relates to CT chest for lung cancer screening.  As a non-smoker for past 15 yeas, she would be ineligible for further  Please make an appointment to discuss CT chest, separate from lung cancer screening program and if she would like to do this going forward

## 2021-12-17 NOTE — Telephone Encounter (Signed)
Spoke to patient she verbalized understanding and stated that she had just had a screening at  [facility] [address] two weeks ago. ?

## 2021-12-31 NOTE — Telephone Encounter (Signed)
Call pt ?Yes she had ct chest 12/03/21 ? ?My question was does she want for me to order annually going forward?  ? ?She has aged out of lung cancer screening program as she is 15 years out of smoking ? ?Does she want to continue screening? ?

## 2022-01-02 NOTE — Telephone Encounter (Signed)
Spoke to patient and patient stated that she would like to continue screenings in the future and would like you to order them for her. ?

## 2022-01-04 ENCOUNTER — Encounter: Payer: Self-pay | Admitting: Family

## 2022-01-14 ENCOUNTER — Ambulatory Visit (INDEPENDENT_AMBULATORY_CARE_PROVIDER_SITE_OTHER): Payer: Medicare HMO

## 2022-01-14 VITALS — Ht 61.0 in | Wt 137.0 lb

## 2022-01-14 DIAGNOSIS — Z0000 Encounter for general adult medical examination without abnormal findings: Secondary | ICD-10-CM | POA: Diagnosis not present

## 2022-01-14 NOTE — Patient Instructions (Addendum)
?  [name] , ?Thank you for taking time to come for your Medicare Wellness Visit. I appreciate your ongoing commitment to your health goals. Please review the following plan we discussed and let me know if I can assist you in the future.  ? ?These are the goals we discussed: ? Goals   ? ?  ? Patient Stated  ?   Maintain Healthy Lifestyle (pt-stated)   ?   Stay active ?Healthy diet ?  ? ?  ?  ?This is a list of the screening recommended for you and due dates:  ?Health Maintenance  ?Topic Date Due  ? Flu Shot  05/14/2022  ? Colon Cancer Screening  10/23/2023  ? Mammogram  11/27/2023  ? Tetanus Vaccine  08/12/2030  ? Pneumonia Vaccine  Completed  ? DEXA scan (bone density measurement)  Completed  ? COVID-19 Vaccine  Completed  ? Hepatitis C Screening: USPSTF Recommendation to screen - Ages 23-79 yo.  Completed  ? Zoster (Shingles) Vaccine  Completed  ? HPV Vaccine  Aged Out  ? Cologuard (Stool DNA test)  Discontinued  ?  ?End of life planning; Advance aging; Advanced directives discussed.  Copy of current HCPOA/Living Will requested.   ? ? ?

## 2022-01-14 NOTE — Progress Notes (Signed)
Subjective:   Jill Arellano is a 71 y.o. female who presents for Medicare Annual (Subsequent) preventive examination.  Review of Systems    No ROS.  Medicare Wellness Virtual Visit.  Visual/audio telehealth visit, UTA vital signs.   See social history for additional risk factors.   Cardiac Risk Factors include: advanced age (>21men, >66 women)     Objective:    Today's Vitals   01/14/22 1120  Weight: 137 lb (62.1 kg)  Height: 5\' 1"  (1.549 m)   Body mass index is 25.89 kg/m.     01/14/2022   11:30 AM 10/22/2021    9:10 AM 07/17/2020    8:52 AM 08/09/2019    9:32 AM 12/29/2017    9:33 AM 08/28/2017   11:28 AM 12/27/2016    9:23 AM  Advanced Directives  Does Patient Have a Medical Advance Directive? Yes No Yes No Yes No No  Type of Estate agent of [place];Living will  Healthcare Power of [place];Living will  Healthcare Power of [place];Living will    Does patient want to make changes to medical advance directive? No - Patient declined  No - Patient declined  No - Patient declined  No - Patient declined  Copy of Healthcare Power of Attorney in Chart? No - copy requested  No - copy requested  No - copy requested    Would patient like information on creating a medical advance directive?      No - Patient declined    Current Medications (verified) Outpatient Encounter Medications as of 01/14/2022  Medication Sig   amLODipine (NORVASC) 10 MG tablet Take 1 tablet (10 mg total) by mouth daily.   APPLE CIDER VINEGAR PO Take by mouth daily.   Calcium Carbonate (CALCIUM 600 PO) Take by mouth daily at 12 noon.   cholecalciferol (VITAMIN D3) 10 MCG (400 UNIT) TABS tablet Take 800 Units by mouth daily.    Omega-3 Fatty Acids (FISH OIL) 1000 MG CAPS Take by mouth daily.   rosuvastatin (CRESTOR) 5 MG tablet TAKE 1 TABLET BY MOUTH ONCE A DAY   No facility-administered encounter medications on file as of 01/14/2022.   Allergies (verified) Patient has no known  allergies.   History: Past Medical History:  Diagnosis Date   Arthritis    hands   Chicken pox    Coronary artery calcification seen on CT scan    a. 05/2017 Cor Ca2+ noted on CT; b. 07/2017 ETT: 1mm inflat ST dep w/ exercise (5:22, HTN response); c. 08/2017 Cath: nl cors. EF 65%.   Diverticulosis    a. 10/2021 Colonoscopy - diverticulosis.   Hypertension    Vitamin D deficiency    Wears dentures    upper   Past Surgical History:  Procedure Laterality Date   CATARACT EXTRACTION, BILATERAL  07/2019   COLONOSCOPY WITH PROPOFOL N/A 08/09/2019   Procedure: COLONOSCOPY WITH PROPOFOL;  Surgeon: Pasty Spillers, MD;  Location: [facility] ENDOSCOPY;  Service: Endoscopy;  Laterality: N/A;   COLONOSCOPY WITH PROPOFOL N/A 10/22/2021   Procedure: COLONOSCOPY WITH PROPOFOL;  Surgeon: Toney Reil, MD;  Location: [facility] ENDOSCOPY;  Service: Endoscopy;  Laterality: N/A;   LEFT HEART CATH AND CORONARY ANGIOGRAPHY N/A 08/28/2017   Procedure: LEFT HEART CATH AND CORONARY ANGIOGRAPHY;  Surgeon: Runell Gess, MD;  [facility]: [facility] INVASIVE [facility] LAB;  Service: Cardiovascular;  Laterality: N/A;   TUBAL LIGATION     Family History  Problem Relation Age of Onset   Heart disease Mother  CHF   Heart failure Mother    Osteoporosis Mother    Cancer Sister        lung   Heart disease Sister        stent placement   Heart failure Brother    Colon cancer Brother 43       colon   Suicidality Grandchild    Breast cancer Neg Hx    Social History   Socioeconomic History   Marital status: Single    Spouse name: Not on file   Number of children: Not on file   Years of education: Not on file   Highest education level: Not on file  Occupational History   Not on file  Tobacco Use   Smoking status: Former    Packs/day: 2.00    Years: 44.00    Pack years: 88.00    Types: Cigarettes    Quit date: 2008    Years since quitting: 15.2   Smokeless tobacco: Former    Quit date: 08/10/1995  Vaping  Use   Vaping Use: Never used  Substance and Sexual Activity   Alcohol use: Yes    Alcohol/week: 1.0 standard drink    Types: 1 Standard drinks or equivalent per week    Comment: beer OCC   Drug use: No   Sexual activity: Never  Other Topics Concern   Not on file  Social History Narrative   Moved from [place] in 2013    Lives by herself   Pets: 2 dogs live inside   Children: 2, daughter (72) and son (33)    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    Works at [employer] 20-30 hrs a week for extra [employer]    Enjoys reading    Social Determinants of Corporate investment banker Strain: Low Risk    Difficulty of Paying Living Expenses: Not hard at [employer] Insecurity: No Food Insecurity   Worried About Programme researcher, broadcasting/film/video in the Last Year: Never true   Barista in the Last Year: Never true  Transportation Needs: No Transportation Needs   Lack of Transportation (Medical): No   Lack of Transportation (Non-Medical): No  Physical Activity: Sufficiently Active   Days of Exercise per Week: 5 days   Minutes of Exercise per Session: 60 min  Stress: No Stress Concern Present   Feeling of Stress : Not at all  Social Connections: Unknown   Frequency of Communication with Friends and Family: More than three times a week   Frequency of Social Gatherings with Friends and Family: More than three times a week   Attends Religious Services: Not on Scientist, clinical (histocompatibility and immunogenetics) or Organizations: Not on file   Attends Banker Meetings: Not on file   Marital Status: Not on file   Tobacco Counseling Counseling given: Not Answered  Clinical Intake:  Pre-visit preparation completed: Yes        Diabetes: No  How often do you need to have someone help you when you read instructions, pamphlets, or other written materials from your doctor or pharmacy?: 1 - Never   Interpreter Needed?: No    Activities of Daily Living    01/14/2022   11:33 AM  In your present state of  health, do you have any difficulty performing the following activities:  Hearing? 0  Vision? 0  Difficulty concentrating or making decisions? 0  Walking or climbing stairs? 0  Dressing or  bathing? 0  Doing errands, shopping? 0  Preparing Food and eating ? N  Using the Toilet? N  In the past six months, have you accidently leaked urine? N  Do you have problems with loss of bowel control? N  Managing your Medications? N  Managing your Finances? N  Housekeeping or managing your Housekeeping? N    Patient Care Team: Allegra Grana, FNP as PCP - General (Family Medicine) Iran Ouch, MD as PCP - Cardiology (Cardiology)  Indicate any recent Medical Services you may have received from other than [facility] providers in the past year (date may be approximate).     Assessment:   This is a routine wellness examination for Jill Arellano.  Virtual Visit via Telephone Note  I connected with  Jill Arellano on 01/14/22 at 11:15 AM EDT by telephone and verified that I am speaking with the correct person using two identifiers.  Persons participating in the virtual visit: patient/Nurse Health Advisor   I discussed the limitations of performing an evaluation and management service by telehealth. The patient expressed understanding and agreed to proceed. We continued and completed visit with audio only. Some vital signs may be absent or patient reported.   Hearing/Vision screen Hearing Screening - Comments:: Patient is able to hear conversational tones without difficulty. No issues reported. Vision Screening - Comments:: Followed by [facility]  Wears corrective lenses  They have regular follow up with the ophthalmologist  Dietary issues and exercise activities discussed: Current Exercise Habits: Home exercise routine, Type of exercise: walking, Time (Minutes): 60, Frequency (Times/Week): 5, Weekly Exercise (Minutes/Week): 300, Intensity: Mild Regular diet Good water intake   Goals  Addressed               This Visit's Progress     Patient Stated     Maintain Healthy Lifestyle (pt-stated)        Stay active Healthy diet       Depression Screen    01/14/2022   11:23 AM 03/19/2021    9:14 AM 11/27/2020   10:23 AM 09/11/2020    8:36 AM 07/17/2020    8:41 AM 07/26/2019    2:03 PM 12/29/2017    9:33 AM  PHQ 2/9 Scores  PHQ - 2 Score 0 0 0 0 0 0 0  PHQ- 9 Score  1  0       Fall Risk    01/14/2022   11:31 AM 11/27/2020   10:23 AM 07/17/2020    8:53 AM 07/26/2019    2:03 PM 12/29/2017    9:33 AM  Fall Risk   Falls in the past year? 0 0 0 0 No  Number falls in past yr: 0 0 0 0   Follow up Falls evaluation completed Falls evaluation completed Falls evaluation completed Falls evaluation completed     FALL RISK PREVENTION PERTAINING TO THE HOME: Home free of loose throw rugs in walkways, pet beds, electrical cords, etc? Yes  Adequate lighting in your home to reduce risk of falls? Yes   ASSISTIVE DEVICES UTILIZED TO PREVENT FALLS: Life alert? No  Use of a cane, walker or w/c? No   TIMED UP AND GO: Was the test performed? No .   Cognitive Function: Patient is alert and oriented x3.      12/27/2016    9:38 AM  MMSE - Mini Mental State Exam  Orientation to time 5  Orientation to Place 5  Registration 3  Attention/ Calculation 5  Recall 3  Language- name 2 objects 2  Language- repeat 1  Language- follow 3 step command 3  Language- read & follow direction 1  Write a sentence 1  Copy design 1  Total score 30        Immunizations Immunization History  Administered Date(s) Administered   Fluad Quad(high Dose 65+) 07/27/2019   Influenza Split 08/28/2010   Influenza-Unspecified 07/10/2020, 08/14/2020, 07/30/2021   Moderna Covid-19 Vaccine Bivalent Booster 64yrs & up 07/30/2021   PFIZER(Purple Top)SARS-COV-2 Vaccination 11/26/2019, 12/24/2019, 08/24/2020   Pneumococcal Conjugate-13 12/27/2016   Pneumococcal Polysaccharide-23 11/22/2014   Td  08/12/2020   Tdap 01/28/2011   Zoster Recombinat (Shingrix) 09/11/2020, 11/13/2020   Zoster, Live 02/14/2014   Screening Tests Health Maintenance  Topic Date Due   INFLUENZA VACCINE  05/14/2022   COLONOSCOPY (Pts 45-51yrs Insurance coverage will need to be confirmed)  10/23/2023   MAMMOGRAM  11/27/2023   TETANUS/TDAP  08/12/2030   Pneumonia Vaccine 57+ Years old  Completed   DEXA SCAN  Completed   COVID-19 Vaccine  Completed   Hepatitis C Screening  Completed   Zoster Vaccines- Shingrix  Completed   HPV VACCINES  Aged Out   Fecal DNA (Cologuard)  Discontinued   Health Maintenance There are no preventive care reminders to display for this patient.  Lung Cancer Screening: (Low Dose CT Chest recommended if Age 83-80 years, 30 pack-year currently smoking OR have quit w/in 15years.) does not qualify.   Vision Screening: Recommended annual ophthalmology exams for early detection of glaucoma and other disorders of the eye.  Dental Screening: Recommended annual dental exams for proper oral hygiene  Community Resource Referral / Chronic Care Management: CRR required this visit?  No   CCM required this visit?  No      Plan:   Keep all routine maintenance appointments.   I have personally reviewed and noted the following in the patient's chart:   Medical and social history Use of alcohol, tobacco or illicit drugs  Current medications and supplements including opioid prescriptions.  Functional ability and status Nutritional status Physical activity Advanced directives List of other physicians Hospitalizations, surgeries, and ER visits in previous 12 months Vitals Screenings to include cognitive, depression, and falls Referrals and appointments  In addition, I have reviewed and discussed with patient certain preventive protocols, quality metrics, and best practice recommendations. A written personalized care plan for preventive services as well as general preventive health  recommendations were provided to patient.     Ashok Pall, LPN   10/19/1094

## 2022-03-25 ENCOUNTER — Ambulatory Visit: Payer: Medicare HMO | Admitting: Family

## 2022-05-01 ENCOUNTER — Ambulatory Visit: Payer: Medicare HMO | Admitting: Family

## 2022-06-12 ENCOUNTER — Ambulatory Visit: Payer: Medicare HMO | Admitting: Family

## 2022-07-10 ENCOUNTER — Encounter: Payer: Self-pay | Admitting: Family

## 2022-07-10 ENCOUNTER — Ambulatory Visit (INDEPENDENT_AMBULATORY_CARE_PROVIDER_SITE_OTHER): Payer: Medicare HMO | Admitting: Family

## 2022-07-10 VITALS — BP 130/70 | HR 67 | Temp 98.4°F | Ht 61.0 in | Wt 136.8 lb

## 2022-07-10 DIAGNOSIS — F4321 Adjustment disorder with depressed mood: Secondary | ICD-10-CM | POA: Diagnosis not present

## 2022-07-10 DIAGNOSIS — I10 Essential (primary) hypertension: Secondary | ICD-10-CM

## 2022-07-10 DIAGNOSIS — I700 Atherosclerosis of aorta: Secondary | ICD-10-CM

## 2022-07-10 DIAGNOSIS — F4320 Adjustment disorder, unspecified: Secondary | ICD-10-CM | POA: Insufficient documentation

## 2022-07-10 LAB — LIPID PANEL
Cholesterol: 195 mg/dL (ref 0–200)
HDL: 113.5 mg/dL (ref >39.00)
LDL Cholesterol: 64 mg/dL (ref 0–99)
NonHDL: 81.86
Total CHOL/HDL Ratio: 2
Triglycerides: 88 mg/dL (ref 0.0–149.0)
VLDL: 17.6 mg/dL (ref 0.0–40.0)

## 2022-07-10 LAB — CBC WITH DIFFERENTIAL/PLATELET
Basophils Absolute: 0 10*3/uL (ref 0.0–0.1)
Basophils Relative: 0.5 % (ref 0.0–3.0)
Eosinophils Absolute: 0.1 10*3/uL (ref 0.0–0.7)
Eosinophils Relative: 1.8 % (ref 0.0–5.0)
HCT: 39.6 % (ref 36.0–46.0)
Hemoglobin: 13.4 g/dL (ref 12.0–15.0)
Lymphocytes Relative: 38.7 % (ref 12.0–46.0)
Lymphs Abs: 1.7 10*3/uL (ref 0.7–4.0)
MCHC: 33.9 g/dL (ref 30.0–36.0)
MCV: 90.4 fl (ref 78.0–100.0)
Monocytes Absolute: 0.3 10*3/uL (ref 0.1–1.0)
Monocytes Relative: 7.9 % (ref 3.0–12.0)
Neutro Abs: 2.2 10*3/uL (ref 1.4–7.7)
Neutrophils Relative %: 51.1 % (ref 43.0–77.0)
Platelets: 215 10*3/uL (ref 150.0–400.0)
RBC: 4.38 Mil/uL (ref 3.87–5.11)
RDW: 12.4 % (ref 11.5–15.5)
WBC: 4.4 10*3/uL (ref 4.0–10.5)

## 2022-07-10 LAB — COMPREHENSIVE METABOLIC PANEL
ALT: 15 U/L (ref 0–35)
AST: 15 U/L (ref 0–37)
Albumin: 4.6 g/dL (ref 3.5–5.2)
Alkaline Phosphatase: 46 U/L (ref 39–117)
BUN: 10 mg/dL (ref 6–23)
CO2: 29 mEq/L (ref 19–32)
Calcium: 9.4 mg/dL (ref 8.4–10.5)
Chloride: 102 mEq/L (ref 96–112)
Creatinine, Ser: 0.7 mg/dL (ref 0.40–1.20)
GFR: 86.06 mL/min (ref >60.00)
Glucose, Bld: 95 mg/dL (ref 70–99)
Potassium: 4.1 mEq/L (ref 3.5–5.1)
Sodium: 138 mEq/L (ref 135–145)
Total Bilirubin: 0.4 mg/dL (ref 0.2–1.2)
Total Protein: 6.9 g/dL (ref 6.0–8.3)

## 2022-07-10 LAB — TSH: TSH: 1.67 u[IU]/mL (ref 0.35–5.50)

## 2022-07-10 LAB — HEMOGLOBIN A1C: Hgb A1c MFr Bld: 5.4 % (ref 4.6–6.5)

## 2022-07-10 LAB — VITAMIN D 25 HYDROXY (VIT D DEFICIENCY, FRACTURES): VITD: 34.58 ng/mL (ref 30.00–100.00)

## 2022-07-10 MED ORDER — LOSARTAN POTASSIUM 25 MG PO TABS: 12.5000 mg | ORAL_TABLET | Freq: Every day | ORAL | 1 refills | Status: DC

## 2022-07-10 NOTE — Progress Notes (Signed)
Subjective:    Patient ID: PARTHENA FERGESON, female    DOB: October 29, 1950, 71 y.o.   MRN: [id number]  CC: GRATIA DISLA is a 71 y.o. female who presents today for follow up.   HPI: Her sister passed away this summer.  She has been grieving this loss She is following with church in grief program. Family is supportive. Strong faith.     Hyperlipidemia-compliant with Crestor 5 mg HTN- compliant with amlodipine 10mg .  No chest pain shortness of breath or leg swelling CT chest due 11/2022  HISTORY:  Past Medical History:  Diagnosis Date  . Arthritis    hands  . Chicken pox   . Coronary artery calcification seen on CT scan    a. 05/2017 Cor Ca2+ noted on CT; b. 07/2017 ETT: 42mm inflat ST dep w/ exercise (5:22, HTN response); c. 08/2017 Cath: nl cors. EF 65%.  . Diverticulosis    a. 10/2021 Colonoscopy - diverticulosis.  . Hypertension   . Vitamin D deficiency   . Wears dentures    upper   Past Surgical History:  Procedure Laterality Date  . CATARACT EXTRACTION, BILATERAL  07/2019  . COLONOSCOPY WITH PROPOFOL N/A 08/09/2019   Procedure: COLONOSCOPY WITH PROPOFOL;  Surgeon: 08/11/2019, MD;  Location: [facility] ENDOSCOPY;  Service: Endoscopy;  Laterality: N/A;  . COLONOSCOPY WITH PROPOFOL N/A 10/22/2021   Procedure: COLONOSCOPY WITH PROPOFOL;  Surgeon: 12/20/2021, MD;  Location: [facility] ENDOSCOPY;  Service: Endoscopy;  Laterality: N/A;  . LEFT HEART CATH AND CORONARY ANGIOGRAPHY N/A 08/28/2017   Procedure: LEFT HEART CATH AND CORONARY ANGIOGRAPHY;  Surgeon: 08/30/2017, MD;  Location[facility] LAB;  Service: Cardiovascular;  Laterality: N/A;  . TUBAL LIGATION     Family History  Problem Relation Age of Onset  . Heart disease Mother        CHF  . Heart failure Mother   . Osteoporosis Mother   . Cancer Sister        lung  . Heart disease Sister        stent placement  . Heart failure Brother   . Colon cancer Brother 74       colon  . Suicidality  Grandchild   . Breast cancer Neg Hx     Allergies: Patient has no known allergies. Current Outpatient Medications on File Prior to Visit  Medication Sig Dispense Refill  . amLODipine (NORVASC) 10 MG tablet Take 1 tablet (10 mg total) by mouth daily. 90 tablet 3  . APPLE CIDER VINEGAR PO Take by mouth daily.    . Calcium Carbonate (CALCIUM 600 PO) Take by mouth daily at 12 noon.    . cholecalciferol (VITAMIN D3) 10 MCG (400 UNIT) TABS tablet Take 800 Units by mouth daily.     . Omega-3 Fatty Acids (FISH OIL) 1000 MG CAPS Take by mouth daily.    . rosuvastatin (CRESTOR) 5 MG tablet TAKE 1 TABLET BY MOUTH ONCE A DAY 90 tablet 3   No current facility-administered medications on file prior to visit.    Social History   Tobacco Use  . Smoking status: Former    Packs/day: 2.00    Years: 44.00    Total pack years: 88.00    Types: Cigarettes    Quit date: 2008    Years since quitting: 15.7  . Smokeless tobacco: Former    Quit date: 08/10/1995  Vaping Use  . Vaping Use: Never used  Substance Use Topics  .  Alcohol use: Yes    Alcohol/week: 1.0 standard drink of alcohol    Types: 1 Standard drinks or equivalent per week    Comment: beer OCC  . Drug use: No    Review of Systems  Constitutional:  Negative for chills and fever.  Respiratory:  Negative for cough.   Cardiovascular:  Negative for chest pain and palpitations.  Gastrointestinal:  Negative for nausea and vomiting.      Objective:    BP 130/70 (BP Location: Left Arm, Patient Position: Sitting, Cuff Size: Normal)   Pulse 67   Temp 98.4 F (36.9 C) (Oral)   Ht 5\' 1"  (1.549 m)   Wt 136 lb 12.8 oz (62.1 kg)   SpO2 97%   BMI 25.85 kg/m  BP Readings from Last 3 Encounters:  07/10/22 130/70  11/26/21 (!) 160/66  10/22/21 (!) 145/76   Wt Readings from Last 3 Encounters:  07/10/22 136 lb 12.8 oz (62.1 kg)  01/14/22 137 lb (62.1 kg)  11/26/21 137 lb 12.8 oz (62.5 kg)    Physical Exam Vitals reviewed.   Constitutional:      Appearance: She is well-developed.  Eyes:     Conjunctiva/sclera: Conjunctivae normal.  Cardiovascular:     Rate and Rhythm: Normal rate and regular rhythm.     Pulses: Normal pulses.     Heart sounds: Normal heart sounds.  Pulmonary:     Effort: Pulmonary effort is normal.     Breath sounds: Normal breath sounds. No wheezing, rhonchi or rales.  Skin:    General: Skin is warm and dry.  Neurological:     Mental Status: She is alert.  Psychiatric:        Speech: Speech normal.        Behavior: Behavior normal.        Thought Content: Thought content normal.       Assessment & Plan:   Problem List Items Addressed This Visit       Cardiovascular and Mediastinum   Atherosclerosis of aorta (HCC)    Chronic, stable.  Continue Crestor 5 mg       Relevant Medications   losartan (COZAAR) 25 MG tablet   HTN (hypertension) - Primary    Slightly elevated as its been in the past.  We discussed adding a low-dose of losartan 12.5 mg amlodipine 10 mg.  Patient is agreeable to this.  BMP in one week's time       Relevant Medications   losartan (COZAAR) 25 MG tablet   Other Relevant Orders   CBC with Differential/Platelet   Comprehensive metabolic panel   Hemoglobin A1c   Lipid panel   TSH   VITAMIN D 25 Hydroxy (Vit-D Deficiency, Fractures)   Basic metabolic panel     Other   Grief reaction    Patient is overall coping well.  She has strong family and faith.  She politely declines any medication or referral to counseling at this time        I am having Larry Sierras. Aslin start on losartan. I am also having her maintain her cholecalciferol, APPLE CIDER VINEGAR PO, Fish Oil, rosuvastatin, Calcium Carbonate (CALCIUM 600 PO), and amLODipine.   Meds ordered this encounter  Medications  . losartan (COZAAR) 25 MG tablet    Sig: Take 0.5 tablets (12.5 mg total) by mouth daily.    Dispense:  90 tablet    Refill:  1    Order Specific Question:    Supervising Provider  Answer:   Sherlene Shams [[id number]]    Return precautions given.   Risks, benefits, and alternatives of the medications and treatment plan prescribed today were discussed, and patient expressed understanding.   Education regarding symptom management and diagnosis given to patient on AVS.  Continue to follow with Allegra Grana, FNP for routine health maintenance.   [name] and I agreed with plan.   Rennie Plowman, FNP

## 2022-07-10 NOTE — Patient Instructions (Signed)
Start losartan 12.5 mg daily.  Goal of blood pressure is less than 120/80.  We will do nonfasting labs in 1 week's time

## 2022-07-10 NOTE — Assessment & Plan Note (Signed)
Chronic, stable.  Continue Crestor 5 mg 

## 2022-07-10 NOTE — Assessment & Plan Note (Signed)
Slightly elevated as its been in the past.  We discussed adding a low-dose of losartan 12.5 mg amlodipine 10 mg.  Patient is agreeable to this.  BMP in one week's time

## 2022-07-10 NOTE — Assessment & Plan Note (Signed)
Patient is overall coping well.  She has strong family and faith.  She politely declines any medication or referral to counseling at this time

## 2022-07-17 ENCOUNTER — Other Ambulatory Visit (INDEPENDENT_AMBULATORY_CARE_PROVIDER_SITE_OTHER): Payer: Medicare HMO

## 2022-07-17 DIAGNOSIS — I10 Essential (primary) hypertension: Secondary | ICD-10-CM | POA: Diagnosis not present

## 2022-07-17 LAB — BASIC METABOLIC PANEL
BUN: 12 mg/dL (ref 6–23)
CO2: 27 mEq/L (ref 19–32)
Calcium: 9.3 mg/dL (ref 8.4–10.5)
Chloride: 101 mEq/L (ref 96–112)
Creatinine, Ser: 0.7 mg/dL (ref 0.40–1.20)
GFR: 86.05 mL/min (ref >60.00)
Glucose, Bld: 86 mg/dL (ref 70–99)
Potassium: 4.1 mEq/L (ref 3.5–5.1)
Sodium: 137 mEq/L (ref 135–145)

## 2022-09-30 ENCOUNTER — Other Ambulatory Visit: Payer: Self-pay | Admitting: Family

## 2022-11-19 ENCOUNTER — Telehealth: Payer: Self-pay | Admitting: Family

## 2022-11-19 NOTE — Telephone Encounter (Signed)
Pharmacy called requesting refill for pt losartan. Pt is taking one a day and need a new script

## 2022-11-20 ENCOUNTER — Other Ambulatory Visit: Payer: Self-pay

## 2022-11-20 DIAGNOSIS — I10 Essential (primary) hypertension: Secondary | ICD-10-CM

## 2022-11-20 MED ORDER — LOSARTAN POTASSIUM 25 MG PO TABS: 25.0000 mg | ORAL_TABLET | Freq: Every day | ORAL | 1 refills | Status: DC

## 2022-11-20 MED ORDER — LOSARTAN POTASSIUM 25 MG PO TABS: 12.5000 mg | ORAL_TABLET | Freq: Every day | ORAL | 1 refills | Status: DC

## 2022-11-20 NOTE — Telephone Encounter (Signed)
Pharmacy called stating the script that was sent still says half a tablet a day and the pt is taking a one whole tablet a day

## 2022-11-20 NOTE — Telephone Encounter (Signed)
Resent

## 2022-11-20 NOTE — Telephone Encounter (Signed)
sent

## 2022-12-12 ENCOUNTER — Telehealth: Payer: Self-pay | Admitting: Family

## 2022-12-12 DIAGNOSIS — I10 Essential (primary) hypertension: Secondary | ICD-10-CM

## 2022-12-12 MED ORDER — AMLODIPINE BESYLATE 10 MG PO TABS: 10.0000 mg | ORAL_TABLET | Freq: Every day | ORAL | 3 refills | Status: DC

## 2022-12-12 NOTE — Addendum Note (Signed)
Addended by: [place], Revonda Menter on: 12/12/2022 09:29 AM   Modules accepted: Orders

## 2022-12-12 NOTE — Telephone Encounter (Signed)
Prescription Request  12/12/2022   LOV: 07/10/2022  What is the name of the medication or equipment? amLODipine (NORVASC) 10 MG tablet   Have you contacted your pharmacy to request a refill? Yes   Which pharmacy would you like this sent to?  [facility] [address] [place] [address] Phone: [phone] Fax: [phone]    Patient notified that their request is being sent to the clinical staff for review and that they should receive a response within 2 business days.   Please advise at [facility] [phone]

## 2022-12-16 ENCOUNTER — Encounter: Payer: Self-pay | Admitting: Family

## 2023-01-08 ENCOUNTER — Telehealth: Payer: Self-pay | Admitting: Family

## 2023-01-08 ENCOUNTER — Ambulatory Visit (INDEPENDENT_AMBULATORY_CARE_PROVIDER_SITE_OTHER): Payer: Medicare HMO | Admitting: Family

## 2023-01-08 ENCOUNTER — Encounter: Payer: Self-pay | Admitting: Family

## 2023-01-08 VITALS — BP 128/72 | HR 98 | Temp 98.0°F | Ht 61.0 in | Wt 124.6 lb

## 2023-01-08 DIAGNOSIS — Z1231 Encounter for screening mammogram for malignant neoplasm of breast: Secondary | ICD-10-CM | POA: Diagnosis not present

## 2023-01-08 DIAGNOSIS — Z136 Encounter for screening for cardiovascular disorders: Secondary | ICD-10-CM | POA: Diagnosis not present

## 2023-01-08 DIAGNOSIS — I700 Atherosclerosis of aorta: Secondary | ICD-10-CM | POA: Diagnosis not present

## 2023-01-08 DIAGNOSIS — Z0000 Encounter for general adult medical examination without abnormal findings: Secondary | ICD-10-CM

## 2023-01-08 DIAGNOSIS — Z13820 Encounter for screening for osteoporosis: Secondary | ICD-10-CM

## 2023-01-08 DIAGNOSIS — I10 Essential (primary) hypertension: Secondary | ICD-10-CM

## 2023-01-08 MED ORDER — AMLODIPINE BESYLATE 10 MG PO TABS: 10.0000 mg | ORAL_TABLET | Freq: Every day | ORAL | 3 refills | Status: DC

## 2023-01-08 MED ORDER — ROSUVASTATIN CALCIUM 5 MG PO TABS: 5.0000 mg | ORAL_TABLET | Freq: Every day | ORAL | 2 refills | Status: DC

## 2023-01-08 MED ORDER — LOSARTAN POTASSIUM 25 MG PO TABS: 25.0000 mg | ORAL_TABLET | Freq: Every day | ORAL | 1 refills | Status: DC

## 2023-01-08 NOTE — Assessment & Plan Note (Signed)
Chronic, stable.  Continue losartan 25 mg daily, amlodipine 10 mg daily

## 2023-01-08 NOTE — Patient Instructions (Addendum)
I have sent a message to [facility] pulmonology to see if you qualify to continue CT chest for lung cancer screening program due to your history of smoking.  I am not sure if you do as you quit more more than 15 years ago.    If you do not hear back from a in the next week with the response, please see MyChart message or call the office.  I ordered an ultrasound of your abdomen to screen for aneurysm. We will call you to schedule this.    please call  and schedule your 3D mammogram and /or bone density scan as we discussed.   [facility]  ( new location in 2023)  [address], [place], [place] [address]  [place], [place] Health Maintenance for Postmenopausal Women Menopause is a normal process in which your ability to get pregnant comes to an end. This process happens slowly over many months or years, usually between the ages of 23 and 36. Menopause is complete when you have missed your menstrual period for 12 months. It is important to talk with your health care provider about some of the most common conditions that affect women after menopause (postmenopausal women). These include heart disease, cancer, and bone loss (osteoporosis). Adopting a healthy lifestyle and getting preventive care can help to promote your health and wellness. The actions you take can also lower your chances of developing some of these common conditions. What are the signs and symptoms of menopause? During menopause, you may have the following symptoms: Hot flashes. These can be moderate or severe. Night sweats. Decrease in sex drive. Mood swings. Headaches. Tiredness (fatigue). Irritability. Memory problems. Problems falling asleep or staying asleep. Talk with your health care provider about treatment options for your symptoms. Do I need hormone replacement therapy? Hormone replacement therapy is effective in treating symptoms that are caused by menopause, such as hot flashes  and night sweats. Hormone replacement carries certain risks, especially as you become older. If you are thinking about using estrogen or estrogen with progestin, discuss the benefits and risks with your health care provider. How can I reduce my risk for heart disease and stroke? The risk of heart disease, heart attack, and stroke increases as you age. One of the causes may be a change in the body's hormones during menopause. This can affect how your body uses dietary fats, triglycerides, and cholesterol. Heart attack and stroke are medical emergencies. There are many things that you can do to help prevent heart disease and stroke. Watch your blood pressure High blood pressure causes heart disease and increases the risk of stroke. This is more likely to develop in people who have high blood pressure readings or are overweight. Have your blood pressure checked: Every 3-5 years if you are 58-21 years of age. Every year if you are 54 years old or older. Eat a healthy diet  Eat a diet that includes plenty of vegetables, fruits, low-fat dairy products, and lean protein. Do not eat a lot of foods that are high in solid fats, added sugars, or sodium. Get regular exercise Get regular exercise. This is one of the most important things you can do for your health. Most adults should: Try to exercise for at least 150 minutes each week. The exercise should increase your heart rate and make you sweat (moderate-intensity exercise). Try to do strengthening exercises at least twice each week. Do these in addition to the moderate-intensity exercise. Spend less time sitting.  Even light physical activity can be beneficial. Other tips Work with your health care provider to achieve or maintain a healthy weight. Do not use any products that contain nicotine or tobacco. These products include cigarettes, chewing tobacco, and vaping devices, such as e-cigarettes. If you need help quitting, ask your health care  provider. Know your numbers. Ask your health care provider to check your cholesterol and your blood sugar (glucose). Continue to have your blood tested as directed by your health care provider. Do I need screening for cancer? Depending on your health history and family history, you may need to have cancer screenings at different stages of your life. This may include screening for: Breast cancer. Cervical cancer. Lung cancer. Colorectal cancer. What is my risk for osteoporosis? After menopause, you may be at increased risk for osteoporosis. Osteoporosis is a condition in which bone destruction happens more quickly than new bone creation. To help prevent osteoporosis or the bone fractures that can happen because of osteoporosis, you may take the following actions: If you are 34-27 years old, get at least 1,000 mg of calcium and at least 600 international units (IU) of vitamin D per day. If you are older than age 13 but younger than age 74, get at least 1,200 mg of calcium and at least 600 international units (IU) of vitamin D per day. If you are older than age 77, get at least 1,200 mg of calcium and at least 800 international units (IU) of vitamin D per day. Smoking and drinking excessive alcohol increase the risk of osteoporosis. Eat foods that are rich in calcium and vitamin D, and do weight-bearing exercises several times each week as directed by your health care provider. How does menopause affect my mental health? Depression may occur at any age, but it is more common as you become older. Common symptoms of depression include: Feeling depressed. Changes in sleep patterns. Changes in appetite or eating patterns. Feeling an overall lack of motivation or enjoyment of activities that you previously enjoyed. Frequent crying spells. Talk with your health care provider if you think that you are experiencing any of these symptoms. General instructions See your health care provider for regular  wellness exams and vaccines. This may include: Scheduling regular health, dental, and eye exams. Getting and maintaining your vaccines. These include: Influenza vaccine. Get this vaccine each year before the flu season begins. Pneumonia vaccine. Shingles vaccine. Tetanus, diphtheria, and pertussis (Tdap) booster vaccine. Your health care provider may also recommend other immunizations. Tell your health care provider if you have ever been abused or do not feel safe at home. Summary Menopause is a normal process in which your ability to get pregnant comes to an end. This condition causes hot flashes, night sweats, decreased interest in sex, mood swings, headaches, or lack of sleep. Treatment for this condition may include hormone replacement therapy. Take actions to keep yourself healthy, including exercising regularly, eating a healthy diet, watching your weight, and checking your blood pressure and blood sugar levels. Get screened for cancer and depression. Make sure that you are up to date with all your vaccines. This information is not intended to replace advice given to you by your health care provider. Make sure you discuss any questions you have with your health care provider. Document Revised: 02/19/2021 Document Reviewed: 02/19/2021 Elsevier Patient Education  [place].

## 2023-01-08 NOTE — Telephone Encounter (Signed)
Jill Arellano  [name] you are well  I wanted your advice regarding either continuing OR discontinuing doing cancer screening.    Patient CT of the chest 12/03/21 Lung rads2   She quit smoking in 2008 so she is 20+ years out from smoking.   She would no longer qualify, correct?   I just wanted to be sure that I wasn't missing anything

## 2023-01-08 NOTE — Assessment & Plan Note (Signed)
Patient declines clinical breast exam today in the office.  Mammogram is due which we have ordered.  Patient longer screens for cervical cancer . patient will schedule mammogram and bone density.  Encouraged continued exercise.  I suspect patient has likely surpassed CT lung cancer screening.  She quit smoking more than 15 years ago.  I sent a message to pulmonology, Eric Form, NP to clarify.  Due to history of smoking, I ordered AAA screen.

## 2023-01-08 NOTE — Progress Notes (Signed)
Assessment & Plan:  Encounter for screening mammogram for malignant neoplasm of breast -     3D Screening Mammogram, Left and Right; Future  Essential hypertension  Hypertension, unspecified type Assessment & Plan: Chronic, stable.  Continue losartan 25 mg daily, amlodipine 10 mg daily   Encounter for screening for osteoporosis -     DG Bone Density; Future  Atherosclerosis of aorta [facility]) Assessment & Plan: Lab Results  Component Value Date   LDLCALC 64 07/10/2022   Excellent control.  Fortunately she remains asymptomatic.  Continue crestor 5mg  .    Encounter for abdominal aortic aneurysm (AAA) screening -     US AORTA; Future  Routine general medical examination at a health care facility Assessment & Plan: Patient declines clinical breast exam today in the office.  Mammogram is due which we have ordered.  Patient longer screens for cervical cancer . patient will schedule mammogram and bone density.  Encouraged continued exercise.  I suspect patient has likely surpassed CT lung cancer screening.  She quit smoking more than 15 years ago.  I sent a message to pulmonology, Eric Form, NP to clarify.  Due to history of smoking, I ordered AAA screen.    Other orders -     amLODIPine Besylate; Take 1 tablet (10 mg total) by mouth daily.  Dispense: 90 tablet; Refill: 3 -     Rosuvastatin Calcium; Take 1 tablet (5 mg total) by mouth daily.  Dispense: 90 tablet; Refill: 2 -     Losartan Potassium; Take 1 tablet (25 mg total) by mouth daily.  Dispense: 90 tablet; Refill: 1     Return precautions given.   Risks, benefits, and alternatives of the medications and treatment plan prescribed today were discussed, and patient expressed understanding.   Education regarding symptom management and diagnosis given to patient on AVS either electronically or printed.  Return in about 6 months (around 07/11/2023) for Medicare Wellness upcoming or due, schedule.  Mable Paris,  FNP  Subjective:    Patient ID: Jill Arellano, female    DOB: [birth year], 72 y.o.   MRN: [id number]  CC: Jill Arellano is a 72 y.o. female who presents today for physical exam.    HPI: Feels well today No new complaints    She denies chest pain shortness of breath,  Colorectal Cancer Screening: UTD , Dr. Marius Ditch, 10/22/2021, repeat in 10-year Breast Cancer Screening: Mammogram due Cervical Cancer Screening: She is no longer screening for cervical cancer.  Last Pap was 5 years ago negative HPV,NIL.  Denies pelvic pain, vaginal bleeding Bone Health screening/DEXA for 65+: due Lung Cancer Screening: She quit smoking 1996. Quit smoking 20 years ago.   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 She does yard work, taking care of home.    Alcohol use: Occasional Smoking/tobacco use: former smoker.    Health Maintenance  Topic Date Due   COVID-19 Vaccine (5 - 2023-24 season) 06/14/2022   Medicare Annual Wellness (AWV)  01/15/2023   MAMMOGRAM  11/27/2023   DTaP/Tdap/Td (3 - Td or Tdap) 08/12/2030   COLONOSCOPY (Pts 45-68yrs Insurance coverage will need to be confirmed)  10/23/2031   Pneumonia Vaccine 69+ Years old  Completed   INFLUENZA VACCINE  Completed   DEXA SCAN  Completed   Hepatitis  C Screening  Completed   Zoster Vaccines- Shingrix  Completed   HPV VACCINES  Aged Out   Fecal DNA (Cologuard)  Discontinued    ALLERGIES: Patient has no known allergies.  Current Outpatient Medications on File Prior to Visit  Medication Sig Dispense Refill   APPLE CIDER VINEGAR PO Take by mouth daily.     Calcium Carbonate (CALCIUM 600 PO) Take by mouth daily at 12 noon.     cholecalciferol (VITAMIN D3) 10 MCG (400 UNIT) TABS tablet Take 800 Units by mouth daily.      Omega-3 Fatty Acids (FISH OIL) 1000 MG CAPS Take by mouth daily.     No current  facility-administered medications on file prior to visit.    Review of Systems  Constitutional:  Negative for chills, fever and unexpected weight change.  HENT:  Negative for congestion.   Respiratory:  Negative for cough.   Cardiovascular:  Negative for chest pain, palpitations and leg swelling.  Gastrointestinal:  Negative for nausea and vomiting.  Musculoskeletal:  Negative for arthralgias and myalgias.  Skin:  Negative for rash.  Neurological:  Negative for headaches.  Hematological:  Negative for adenopathy.  Psychiatric/Behavioral:  Negative for confusion.       Objective:    BP 128/72   Pulse 98   Temp 98 F (36.7 C) (Oral)   Ht 5\' 1"  (1.549 m)   Wt 124 lb 9.6 oz (56.5 kg)   SpO2 99%   BMI 23.54 kg/m   BP Readings from Last 3 Encounters:  01/08/23 128/72  07/10/22 130/70  11/26/21 (!) 160/66   Wt Readings from Last 3 Encounters:  01/08/23 124 lb 9.6 oz (56.5 kg)  07/10/22 136 lb 12.8 oz (62.1 kg)  01/14/22 137 lb (62.1 kg)    Physical Exam Vitals reviewed.  Constitutional:      Appearance: Normal appearance. She is well-developed.  Eyes:     Conjunctiva/sclera: Conjunctivae normal.  Neck:     Thyroid: No thyroid mass or thyromegaly.  Cardiovascular:     Rate and Rhythm: Normal rate and regular rhythm.     Pulses: Normal pulses.     Heart sounds: Normal heart sounds.  Pulmonary:     Effort: Pulmonary effort is normal.     Breath sounds: Normal breath sounds. No wheezing, rhonchi or rales.  Abdominal:     General: Bowel sounds are normal. There is no distension.     Palpations: Abdomen is soft. Abdomen is not rigid. There is no fluid wave or mass.     Tenderness: There is no abdominal tenderness. There is no guarding or rebound.  Lymphadenopathy:     Head:     Right side of head: No submental, submandibular, tonsillar, preauricular, posterior auricular or occipital adenopathy.     Left side of head: No submental, submandibular, tonsillar,  preauricular, posterior auricular or occipital adenopathy.     Cervical: No cervical adenopathy.  Skin:    General: Skin is warm and dry.  Neurological:     Mental Status: She is alert.  Psychiatric:        Speech: Speech normal.        Behavior: Behavior normal.        Thought Content: Thought content normal.

## 2023-01-08 NOTE — Assessment & Plan Note (Signed)
Lab Results  Component Value Date   LDLCALC 64 07/10/2022   Excellent control.  Fortunately she remains asymptomatic.  Continue crestor 5mg  .

## 2023-01-15 ENCOUNTER — Encounter: Payer: Self-pay | Admitting: Family

## 2023-01-15 NOTE — Telephone Encounter (Signed)
noted 

## 2023-01-24 ENCOUNTER — Ambulatory Visit (INDEPENDENT_AMBULATORY_CARE_PROVIDER_SITE_OTHER): Payer: Medicare HMO

## 2023-01-24 ENCOUNTER — Ambulatory Visit: Payer: Medicare HMO

## 2023-01-24 VITALS — Ht 61.0 in | Wt 119.2 lb

## 2023-01-24 DIAGNOSIS — Z0000 Encounter for general adult medical examination without abnormal findings: Secondary | ICD-10-CM

## 2023-01-24 NOTE — Patient Instructions (Signed)
Jill Arellano , Thank you for taking time to come for your Medicare Wellness Visit. I appreciate your ongoing commitment to your health goals. Please review the following plan we discussed and let me know if I can assist you in the future.   These are the goals we discussed:  Goals       Patient Stated     Weight goal 115lb (pt-stated)      Stay active Healthy diet        This is a list of the screening recommended for you and due dates:  Health Maintenance  Topic Date Due   COVID-19 Vaccine (5 - 2023-24 season) 02/09/2023*   Flu Shot  05/15/2023   Mammogram  11/27/2023   Medicare Annual Wellness Visit  01/24/2024   DTaP/Tdap/Td vaccine (3 - Td or Tdap) 08/12/2030   Colon Cancer Screening  10/23/2031   Pneumonia Vaccine  Completed   DEXA scan (bone density measurement)  Completed   Hepatitis C Screening: USPSTF Recommendation to screen - Ages 15-79 yo.  Completed   Zoster (Shingles) Vaccine  Completed   HPV Vaccine  Aged Out   Cologuard (Stool DNA test)  Discontinued  *Topic was postponed. The date shown is not the original due date.   Conditions/risks identified: none new  Next appointment: Follow up in one year for your annual wellness visit    Preventive Care 65 Years and Older, Female Preventive care refers to lifestyle choices and visits with your health care provider that can promote health and wellness. What does preventive care include? A yearly physical exam. This is also called an annual well check. Dental exams once or twice a year. Routine eye exams. Ask your health care provider how often you should have your eyes checked. Personal lifestyle choices, including: Daily care of your teeth and gums. Regular physical activity. Eating a healthy diet. Avoiding tobacco and drug use. Limiting alcohol use. Practicing safe sex. Taking low-dose aspirin every day. Taking vitamin and mineral supplements as recommended by your health care provider. What happens during  an annual well check? The services and screenings done by your health care provider during your annual well check will depend on your age, overall health, lifestyle risk factors, and family history of disease. Counseling  Your health care provider may ask you questions about your: Alcohol use. Tobacco use. Drug use. Emotional well-being. Home and relationship well-being. Sexual activity. Eating habits. History of falls. Memory and ability to understand (cognition). Work and work Astronomer. Reproductive health. Screening  You may have the following tests or measurements: Height, weight, and BMI. Blood pressure. Lipid and cholesterol levels. These may be checked every 5 years, or more frequently if you are over 45 years old. Skin check. Lung cancer screening. You may have this screening every year starting at age 79 if you have a 30-pack-year history of smoking and currently smoke or have quit within the past 15 years. Fecal occult blood test (FOBT) of the stool. You may have this test every year starting at age 45. Flexible sigmoidoscopy or colonoscopy. You may have a sigmoidoscopy every 5 years or a colonoscopy every 10 years starting at age 64. Hepatitis C blood test. Hepatitis B blood test. Sexually transmitted disease (STD) testing. Diabetes screening. This is done by checking your blood sugar (glucose) after you have not eaten for a while (fasting). You may have this done every 1-3 years. Bone density scan. This is done to screen for osteoporosis. You may have this done starting  at age 8. Mammogram. This may be done every 1-2 years. Talk to your health care provider about how often you should have regular mammograms. Talk with your health care provider about your test results, treatment options, and if necessary, the need for more tests. Vaccines  Your health care provider may recommend certain vaccines, such as: Influenza vaccine. This is recommended every year. Tetanus,  diphtheria, and acellular pertussis (Tdap, Td) vaccine. You may need a Td booster every 10 years. Zoster vaccine. You may need this after age 22. Pneumococcal 13-valent conjugate (PCV13) vaccine. One dose is recommended after age 23. Pneumococcal polysaccharide (PPSV23) vaccine. One dose is recommended after age 57. Talk to your health care provider about which screenings and vaccines you need and how often you need them. This information is not intended to replace advice given to you by your health care provider. Make sure you discuss any questions you have with your health care provider. Document Released: 10/27/2015 Document Revised: 06/19/2016 Document Reviewed: 08/01/2015 Elsevier Interactive Patient Education  2017 [place] Prevention in the Home Falls can cause injuries. They can happen to people of all ages. There are many things you can do to make your home safe and to help prevent falls. What can I do on the outside of my home? Regularly fix the edges of walkways and driveways and fix any cracks. Remove anything that might make you trip as you walk through a door, such as a raised step or threshold. Trim any bushes or trees on the path to your home. Use bright outdoor lighting. Clear any walking paths of anything that might make someone trip, such as rocks or tools. Regularly check to see if handrails are loose or broken. Make sure that both sides of any steps have handrails. Any raised decks and porches should have guardrails on the edges. Have any leaves, snow, or ice cleared regularly. Use sand or salt on walking paths during winter. Clean up any spills in your garage right away. This includes oil or grease spills. What can I do in the bathroom? Use night lights. Install grab bars by the toilet and in the tub and shower. Do not use towel bars as grab bars. Use non-skid mats or decals in the tub or shower. If you need to sit down in the shower, use a plastic,  non-slip stool. Keep the floor dry. Clean up any water that spills on the floor as soon as it happens. Remove soap buildup in the tub or shower regularly. Attach bath mats securely with double-sided non-slip rug tape. Do not have throw rugs and other things on the floor that can make you trip. What can I do in the bedroom? Use night lights. Make sure that you have a light by your bed that is easy to reach. Do not use any sheets or blankets that are too big for your bed. They should not hang down onto the floor. Have a firm chair that has side arms. You can use this for support while you get dressed. Do not have throw rugs and other things on the floor that can make you trip. What can I do in the kitchen? Clean up any spills right away. Avoid walking on wet floors. Keep items that you use a lot in easy-to-reach places. If you need to reach something above you, use a strong step stool that has a grab bar. Keep electrical cords out of the way. Do not use floor polish or wax that makes  floors slippery. If you must use wax, use non-skid floor wax. Do not have throw rugs and other things on the floor that can make you trip. What can I do with my stairs? Do not leave any items on the stairs. Make sure that there are handrails on both sides of the stairs and use them. Fix handrails that are broken or loose. Make sure that handrails are as long as the stairways. Check any carpeting to make sure that it is firmly attached to the stairs. Fix any carpet that is loose or worn. Avoid having throw rugs at the top or bottom of the stairs. If you do have throw rugs, attach them to the floor with carpet tape. Make sure that you have a light switch at the top of the stairs and the bottom of the stairs. If you do not have them, ask someone to add them for you. What else can I do to help prevent falls? Wear shoes that: Do not have high heels. Have rubber bottoms. Are comfortable and fit you well. Are closed  at the toe. Do not wear sandals. If you use a stepladder: Make sure that it is fully opened. Do not climb a closed stepladder. Make sure that both sides of the stepladder are locked into place. Ask someone to hold it for you, if possible. Clearly mark and make sure that you can see: Any grab bars or handrails. First and last steps. Where the edge of each step is. Use tools that help you move around (mobility aids) if they are needed. These include: Canes. Walkers. Scooters. Crutches. Turn on the lights when you go into a dark area. Replace any light bulbs as soon as they burn out. Set up your furniture so you have a clear path. Avoid moving your furniture around. If any of your floors are uneven, fix them. If there are any pets around you, be aware of where they are. Review your medicines with your doctor. Some medicines can make you feel dizzy. This can increase your chance of falling. Ask your doctor what other things that you can do to help prevent falls. This information is not intended to replace advice given to you by your health care provider. Make sure you discuss any questions you have with your health care provider. Document Released: 07/27/2009 Document Revised: 03/07/2016 Document Reviewed: 11/04/2014 Elsevier Interactive Patient Education  2017 [employer].

## 2023-01-24 NOTE — Progress Notes (Signed)
Subjective:   Jill Arellano is a 72 y.o. female who presents for Medicare Annual (Subsequent) preventive examination.  Review of Systems    No ROS.  Medicare Wellness Virtual Visit.  Visual/audio telehealth visit, UTA vital signs.   See social history for additional risk factors.   Cardiac Risk Factors include: advanced age (>24men, >18 women)     Objective:    Today's Vitals   01/24/23 0853  Weight: 119 lb 3.2 oz (54.1 kg)  Height: 5\' 1"  (1.549 m)   Body mass index is 22.52 kg/m.     01/24/2023    8:59 AM 01/14/2022   11:30 AM 10/22/2021    9:10 AM 07/17/2020    8:52 AM 08/09/2019    9:32 AM 12/29/2017    9:33 AM 08/28/2017   11:28 AM  Advanced Directives  Does Patient Have a Medical Advance Directive? No Yes No Yes No Yes No  Type of Special educational needs teacher of [place];Living will  Healthcare Power of [place];Living will  Healthcare Power of [place];Living will   Does patient want to make changes to medical advance directive?  No - Patient declined  No - Patient declined  No - Patient declined   Copy of Healthcare Power of Attorney in Chart?  No - copy requested  No - copy requested  No - copy requested   Would patient like information on creating a medical advance directive? No - Patient declined      No - Patient declined    Current Medications (verified) Outpatient Encounter Medications as of 01/24/2023  Medication Sig   amLODipine (NORVASC) 10 MG tablet Take 1 tablet (10 mg total) by mouth daily.   APPLE CIDER VINEGAR PO Take by mouth daily.   Calcium Carbonate (CALCIUM 600 PO) Take by mouth daily at 12 noon.   cholecalciferol (VITAMIN D3) 10 MCG (400 UNIT) TABS tablet Take 800 Units by mouth daily.    losartan (COZAAR) 25 MG tablet Take 1 tablet (25 mg total) by mouth daily.   Omega-3 Fatty Acids (FISH OIL) 1000 MG CAPS Take by mouth daily.   rosuvastatin (CRESTOR) 5 MG tablet Take 1 tablet (5 mg total) by mouth daily.   No facility-administered  encounter medications on file as of 01/24/2023.    Allergies (verified) Patient has no known allergies.   History: Past Medical History:  Diagnosis Date   Arthritis    hands   Chicken pox    Coronary artery calcification seen on CT scan    a. 05/2017 Cor Ca2+ noted on CT; b. 07/2017 ETT: 1mm inflat ST dep w/ exercise (5:22, HTN response); c. 08/2017 Cath: nl cors. EF 65%.   Diverticulosis    a. 10/2021 Colonoscopy - diverticulosis.   Hypertension    Vitamin D deficiency    Wears dentures    upper   Past Surgical History:  Procedure Laterality Date   CATARACT EXTRACTION, BILATERAL  07/2019   COLONOSCOPY WITH PROPOFOL N/A 08/09/2019   Procedure: COLONOSCOPY WITH PROPOFOL;  Surgeon: Pasty Spillers, MD;  Location: [facility] ENDOSCOPY;  Service: Endoscopy;  Laterality: N/A;   COLONOSCOPY WITH PROPOFOL N/A 10/22/2021   Procedure: COLONOSCOPY WITH PROPOFOL;  Surgeon: Toney Reil, MD;  [facility] ENDOSCOPY;  Service: Endoscopy;  Laterality: N/A;   LEFT HEART CATH AND CORONARY ANGIOGRAPHY N/A 08/28/2017   Procedure: LEFT HEART CATH AND CORONARY ANGIOGRAPHY;  Surgeon: Runell Gess, MD;  Location: [facility] INVASIVE CV LAB;  Service: Cardiovascular;  Laterality: N/A;  TUBAL LIGATION     Family History  Problem Relation Age of Onset   Heart disease Mother        CHF   Heart failure Mother    Osteoporosis Mother    Cancer Sister        lung   Heart disease Sister        stent placement   Heart failure Brother    Colon cancer Brother 72       colon   Suicidality Grandchild    Breast cancer Neg Hx    Social History   Socioeconomic History   Marital status: Single    Spouse name: Not on file   Number of children: Not on file   Years of education: Not on file   Highest education level: Not on file  Occupational History   Not on file  Tobacco Use   Smoking status: Former    Packs/day: 2.00    Years: 44.00    Additional pack years: 0.00    Total pack years: 88.00     Types: Cigarettes    Quit date: 2008    Years since quitting: 16.2   Smokeless tobacco: Former    Quit date: 08/10/1995  Vaping Use   Vaping Use: Never used  Substance and Sexual Activity   Alcohol use: Yes    Alcohol/week: 1.0 standard drink of alcohol    Types: 1 Standard drinks or equivalent per week    Comment: beer OCC   Drug use: No   Sexual activity: Never  Other Topics Concern   Not on file  Social History Narrative   Moved from [place] in 2013    Lives by herself   Pets: 2 dogs live inside   Children: 2, daughter (34) and son (95)    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    Works at [employer] 20-30 hrs a week for extra [employer]    Enjoys reading       sister passed away 05/21/2022; she was her best friend   Social Determinants of Corporate investment banker Strain: Low Risk  (01/23/2023)   Overall Financial Resource Strain (CARDIA)    Difficulty of Paying Living Expenses: Not hard at all  Food Insecurity: No Food Insecurity (01/23/2023)   Hunger Vital Sign    Worried About Running Out of Food in the Last Year: Never true    Ran Out of Food in the Last Year: Never true  Transportation Needs: No Transportation Needs (01/23/2023)   PRAPARE - Administrator, Civil Service (Medical): No    Lack of Transportation (Non-Medical): No  Physical Activity: Sufficiently Active (01/23/2023)   Exercise Vital Sign    Days of Exercise per Week: 6 days    Minutes of Exercise per Session: 30 min  Stress: No Stress Concern Present (01/23/2023)   [employer] of Occupational Health - Occupational Stress Questionnaire    Feeling of Stress : Not at all  Social Connections: Unknown (01/23/2023)   Social Connection and Isolation Panel [NHANES]    Frequency of Communication with Friends and Family: More than three times a week    Frequency of Social Gatherings with Friends and Family: Three times a week    Attends Religious Services: Not on file    Active Member of Clubs  or Organizations: Yes    Attends Club or Organization Meetings: More than 4 times per year    Marital Status: Divorced  Tobacco Counseling Counseling given: Not Answered   Clinical Intake:  Pre-visit preparation completed: Yes           How often do you need to have someone help you when you read instructions, pamphlets, or other written materials from your doctor or pharmacy?: 1 - Never    Interpreter Needed?: No      Activities of Daily Living    01/23/2023    8:34 AM  In your present state of health, do you have any difficulty performing the following activities:  Hearing? 0  Vision? 0  Difficulty concentrating or making decisions? 0  Walking or climbing stairs? 0  Dressing or bathing? 0  Doing errands, shopping? 0  Preparing Food and eating ? N  Using the Toilet? N  In the past six months, have you accidently leaked urine? N  Do you have problems with loss of bowel control? N  Managing your Medications? N  Managing your Finances? N  Housekeeping or managing your Housekeeping? N    Patient Care Team: Allegra Grana, FNP as PCP - General (Family Medicine) Iran Ouch, MD as PCP - Cardiology (Cardiology)  Indicate any recent Medical Services you may have received from other than [facility] providers in the past year (date may be approximate).     Assessment:   This is a routine wellness examination for [place].  Patient Medicare AWV questionnaire was completed by the patient on 01/23/23, I have confirmed that all information answered by patient is correct and no changes since this date.   I connected with  Smita Lesh Larch on 01/24/23 by a audio enabled telemedicine application and verified that I am speaking with the correct person using two identifiers.  Patient Location: Home  Provider Location: Office/Clinic  I discussed the limitations of evaluation and management by telemedicine. The patient expressed understanding and agreed to proceed.    Hearing/Vision screen Hearing Screening - Comments:: Patient is able to hear conversational tones without difficulty. No issues reported. Vision Screening - Comments:: Followed by [facility]ears corrective lenses They have regular follow up with the ophthalmologist    Dietary issues and exercise activities discussed: Current Exercise Habits: Home exercise routine, Type of exercise: walking, Time (Minutes): 60, Frequency (Times/Week): 6, Weekly Exercise (Minutes/Week): 360, Intensity: Mild   Goals Addressed               This Visit's Progress     Patient Stated     Weight goal 115lb (pt-stated)        Stay active Healthy diet       Depression Screen    01/24/2023    8:57 AM 01/08/2023    8:24 AM 07/10/2022    8:05 AM 01/14/2022   11:23 AM 03/19/2021    9:14 AM 11/27/2020   10:23 AM 09/11/2020    8:36 AM  PHQ 2/9 Scores  PHQ - 2 Score 0 0 0 0 0 0 0  PHQ- 9 Score 0 0   1  0    Fall Risk    01/23/2023    8:34 AM 01/08/2023    8:24 AM 07/10/2022    8:05 AM 01/14/2022   11:31 AM 11/27/2020   10:23 AM  Fall Risk   Falls in the past year? 1 0 0 0 0  Number falls in past yr: 0 0 0 0 0  Injury with Fall? 0 0 0    Risk for fall due to :  No Fall Risks  No Fall Risks    Follow up Falls evaluation completed;Falls prevention discussed Falls evaluation completed Falls evaluation completed Falls evaluation completed Falls evaluation completed    FALL RISK PREVENTION PERTAINING TO THE HOME: Home free of loose throw rugs in walkways, pet beds, electrical cords, etc? Yes  Adequate lighting in your home to reduce risk of falls? Yes   ASSISTIVE DEVICES UTILIZED TO PREVENT FALLS: Life alert? No  Use of a cane, walker or w/c? No  Grab bars in the bathroom? No  Shower chair or bench in shower? No  Elevated toilet seat or a handicapped toilet? No   TIMED UP AND GO: Was the test performed? No .    Cognitive Function:    12/27/2016    9:38 AM  MMSE - Mini Mental State Exam   Orientation to time 5  Orientation to Place 5  Registration 3  Attention/ Calculation 5  Recall 3  Language- name 2 objects 2  Language- repeat 1  Language- follow 3 step command 3  Language- read & follow direction 1  Write a sentence 1  Copy design 1  Total score 30        01/24/2023    9:00 AM  6CIT Screen  What Year? 0 points  What month? 0 points  What time? 0 points  Count back from 20 0 points  Months in reverse 0 points  Repeat phrase 0 points  Total Score 0 points    Immunizations Immunization History  Administered Date(s) Administered   Fluad Quad(high Dose 65+) 07/27/2019, 07/05/2022   Influenza Split 08/28/2010   Influenza-Unspecified 07/10/2020, 08/14/2020, 07/30/2021   Moderna Covid-19 Vaccine Bivalent Booster 2yrs & up 07/30/2021   PFIZER(Purple Top)SARS-COV-2 Vaccination 11/26/2019, 12/24/2019, 08/24/2020   Pneumococcal Conjugate-13 12/27/2016   Pneumococcal Polysaccharide-23 11/22/2014   Td 08/12/2020   Tdap 01/28/2011   Zoster Recombinat (Shingrix) 09/11/2020, 11/13/2020   Zoster, Live 02/14/2014   Screening Tests Health Maintenance  Topic Date Due   COVID-19 Vaccine (5 - 2023-24 season) 02/09/2023 (Originally 06/14/2022)   INFLUENZA VACCINE  05/15/2023   MAMMOGRAM  11/27/2023   Medicare Annual Wellness (AWV)  01/24/2024   DTaP/Tdap/Td (3 - Td or Tdap) 08/12/2030   COLONOSCOPY (Pts 45-14yrs Insurance coverage will need to be confirmed)  10/23/2031   Pneumonia Vaccine 75+ Years old  Completed   DEXA SCAN  Completed   Hepatitis C Screening  Completed   Zoster Vaccines- Shingrix  Completed   HPV VACCINES  Aged Out   Fecal DNA (Cologuard)  Discontinued    Health Maintenance There are no preventive care reminders to display for this patient.  Lung Cancer Screening: (Low Dose CT Chest recommended if Age 35-80 years, 30 pack-year currently smoking OR have quit w/in 15years.) does not qualify.   Hepatitis C Screening: Completed  05/2017.  Vision Screening: Recommended annual ophthalmology exams for early detection of glaucoma and other disorders of the eye.  Dental Screening: Recommended annual dental exams for proper oral hygiene  Community Resource Referral / Chronic Care Management: CRR required this visit?  No   CCM required this visit?  No      Plan:     I have personally reviewed and noted the following in the patient's chart:   Medical and social history Use of alcohol, tobacco or illicit drugs  Current medications and supplements including opioid prescriptions. Patient is not currently taking opioid prescriptions. Functional ability and status Nutritional status Physical activity Advanced directives List of other physicians Hospitalizations, surgeries,  and ER visits in previous 12 months Vitals Screenings to include cognitive, depression, and falls Referrals and appointments  In addition, I have reviewed and discussed with patient certain preventive protocols, quality metrics, and best practice recommendations. A written personalized care plan for preventive services as well as general preventive health recommendations were provided to patient.     Cathey Endow, LPN   01/20/8118

## 2023-02-25 ENCOUNTER — Other Ambulatory Visit: Payer: Self-pay | Admitting: Family

## 2023-02-28 ENCOUNTER — Other Ambulatory Visit: Payer: Self-pay | Admitting: Family

## 2023-02-28 DIAGNOSIS — Z13820 Encounter for screening for osteoporosis: Secondary | ICD-10-CM

## 2023-02-28 DIAGNOSIS — Z0000 Encounter for general adult medical examination without abnormal findings: Secondary | ICD-10-CM

## 2023-02-28 DIAGNOSIS — Z136 Encounter for screening for cardiovascular disorders: Secondary | ICD-10-CM

## 2023-02-28 DIAGNOSIS — I10 Essential (primary) hypertension: Secondary | ICD-10-CM

## 2023-02-28 DIAGNOSIS — Z1231 Encounter for screening mammogram for malignant neoplasm of breast: Secondary | ICD-10-CM

## 2023-02-28 DIAGNOSIS — I700 Atherosclerosis of aorta: Secondary | ICD-10-CM

## 2023-03-03 ENCOUNTER — Ambulatory Visit
Admission: RE | Admit: 2023-03-03 | Discharge: 2023-03-03 | Disposition: A | Discharge status: Home / Self Care | Payer: Medicare HMO | Source: Ambulatory Visit | Attending: Family | Admitting: Family

## 2023-03-03 DIAGNOSIS — Z136 Encounter for screening for cardiovascular disorders: Secondary | ICD-10-CM | POA: Diagnosis not present

## 2023-03-03 DIAGNOSIS — E785 Hyperlipidemia, unspecified: Secondary | ICD-10-CM | POA: Insufficient documentation

## 2023-03-03 DIAGNOSIS — I10 Essential (primary) hypertension: Secondary | ICD-10-CM

## 2023-03-03 DIAGNOSIS — Z87891 Personal history of nicotine dependence: Secondary | ICD-10-CM | POA: Insufficient documentation

## 2023-03-24 ENCOUNTER — Ambulatory Visit
Admission: RE | Admit: 2023-03-24 | Discharge: 2023-03-24 | Disposition: A | Discharge status: Home / Self Care | Payer: Medicare HMO | Source: Ambulatory Visit | Attending: Family | Admitting: Family

## 2023-03-24 DIAGNOSIS — M8589 Other specified disorders of bone density and structure, multiple sites: Secondary | ICD-10-CM | POA: Diagnosis not present

## 2023-03-24 DIAGNOSIS — Z780 Asymptomatic menopausal state: Secondary | ICD-10-CM | POA: Diagnosis not present

## 2023-03-24 DIAGNOSIS — Z13820 Encounter for screening for osteoporosis: Secondary | ICD-10-CM | POA: Diagnosis not present

## 2023-03-24 DIAGNOSIS — Z1231 Encounter for screening mammogram for malignant neoplasm of breast: Secondary | ICD-10-CM | POA: Insufficient documentation

## 2023-06-17 ENCOUNTER — Other Ambulatory Visit: Payer: Self-pay | Admitting: Medical Genetics

## 2023-06-17 DIAGNOSIS — Z006 Encounter for examination for normal comparison and control in clinical research program: Secondary | ICD-10-CM

## 2023-07-16 ENCOUNTER — Ambulatory Visit: Payer: Medicare HMO | Admitting: Family

## 2023-07-16 ENCOUNTER — Encounter: Payer: Self-pay | Admitting: Family

## 2023-07-16 VITALS — BP 110/72 | HR 54 | Temp 98.2°F | Ht 61.0 in | Wt 120.2 lb

## 2023-07-16 DIAGNOSIS — Z13220 Encounter for screening for lipoid disorders: Secondary | ICD-10-CM | POA: Diagnosis not present

## 2023-07-16 DIAGNOSIS — M79642 Pain in left hand: Secondary | ICD-10-CM

## 2023-07-16 DIAGNOSIS — I10 Essential (primary) hypertension: Secondary | ICD-10-CM | POA: Diagnosis not present

## 2023-07-16 DIAGNOSIS — Z136 Encounter for screening for cardiovascular disorders: Secondary | ICD-10-CM

## 2023-07-16 DIAGNOSIS — M79641 Pain in right hand: Secondary | ICD-10-CM | POA: Diagnosis not present

## 2023-07-16 DIAGNOSIS — I700 Atherosclerosis of aorta: Secondary | ICD-10-CM

## 2023-07-16 DIAGNOSIS — Z8639 Personal history of other endocrine, nutritional and metabolic disease: Secondary | ICD-10-CM | POA: Diagnosis not present

## 2023-07-16 LAB — CBC WITH DIFFERENTIAL/PLATELET
Basophils Absolute: 0 10*3/uL (ref 0.0–0.1)
Basophils Relative: 0.4 % (ref 0.0–3.0)
Eosinophils Absolute: 0.1 10*3/uL (ref 0.0–0.7)
Eosinophils Relative: 2.1 % (ref 0.0–5.0)
HCT: 41.9 % (ref 36.0–46.0)
Hemoglobin: 13.6 g/dL (ref 12.0–15.0)
Lymphocytes Relative: 44.6 % (ref 12.0–46.0)
Lymphs Abs: 2.1 10*3/uL (ref 0.7–4.0)
MCHC: 32.4 g/dL (ref 30.0–36.0)
MCV: 94.6 fL (ref 78.0–100.0)
Monocytes Absolute: 0.4 10*3/uL (ref 0.1–1.0)
Monocytes Relative: 8.8 % (ref 3.0–12.0)
Neutro Abs: 2.1 10*3/uL (ref 1.4–7.7)
Neutrophils Relative %: 44.1 % (ref 43.0–77.0)
Platelets: 220 10*3/uL (ref 150.0–400.0)
RBC: 4.42 Mil/uL (ref 3.87–5.11)
RDW: 12.4 % (ref 11.5–15.5)
WBC: 4.7 10*3/uL (ref 4.0–10.5)

## 2023-07-16 LAB — COMPREHENSIVE METABOLIC PANEL
ALT: 19 U/L (ref 0–35)
AST: 16 U/L (ref 0–37)
Albumin: 4.5 g/dL (ref 3.5–5.2)
Alkaline Phosphatase: 48 U/L (ref 39–117)
BUN: 15 mg/dL (ref 6–23)
CO2: 30 meq/L (ref 19–32)
Calcium: 9.6 mg/dL (ref 8.4–10.5)
Chloride: 103 meq/L (ref 96–112)
Creatinine, Ser: 0.65 mg/dL (ref 0.40–1.20)
GFR: 86.99 mL/min (ref >60.00)
Glucose, Bld: 87 mg/dL (ref 70–99)
Potassium: 4.4 meq/L (ref 3.5–5.1)
Sodium: 139 meq/L (ref 135–145)
Total Bilirubin: 0.5 mg/dL (ref 0.2–1.2)
Total Protein: 6.7 g/dL (ref 6.0–8.3)

## 2023-07-16 LAB — LIPID PANEL
Cholesterol: 250 mg/dL — ABNORMAL HIGH (ref 0–200)
HDL: 152.9 mg/dL (ref >39.00)
LDL Cholesterol: 83 mg/dL (ref 0–99)
NonHDL: 97.05
Total CHOL/HDL Ratio: 2
Triglycerides: 72 mg/dL (ref 0.0–149.0)
VLDL: 14.4 mg/dL (ref 0.0–40.0)

## 2023-07-16 LAB — VITAMIN D 25 HYDROXY (VIT D DEFICIENCY, FRACTURES): VITD: 24.66 ng/mL — ABNORMAL LOW (ref 30.00–100.00)

## 2023-07-16 LAB — TSH: TSH: 2.36 u[IU]/mL (ref 0.35–5.50)

## 2023-07-16 LAB — HEMOGLOBIN A1C: Hgb A1c MFr Bld: 5.1 % (ref 4.6–6.5)

## 2023-07-16 NOTE — Patient Instructions (Addendum)
Trial of voltaren gel for hands/wrists.   Trial aleve with food for 1-2 weeks.  Icing 20 minutes twice daily  Let me know if doesn't improve and I can arrange consult with Dr Stephenie Acres  Carpal Tunnel Syndrome  Carpal tunnel syndrome is a condition that causes pain, numbness, and weakness in your hand and fingers. The carpal tunnel is a narrow area located on the palm side of your wrist. Repeated wrist motion or certain diseases may cause swelling within the tunnel. This swelling pinches the main nerve in the wrist. The main nerve in the wrist is called the median nerve. What are the causes? This condition may be caused by: Repeated and forceful wrist and hand motions. Wrist injuries. Arthritis. A cyst or tumor in the carpal tunnel. Fluid buildup during pregnancy. Use of tools that vibrate. Sometimes the cause of this condition is not known. What increases the risk? The following factors may make you more likely to develop this condition: Having a job that requires you to repeatedly or forcefully move your wrist or hand or requires you to use tools that vibrate. This may include jobs that involve using computers, working on an [employer], or working with power tools such as Radiographer, therapeutic. Being a woman. Having certain conditions, such as: Diabetes. Obesity. An underactive thyroid (hypothyroidism). Kidney failure. Rheumatoid arthritis. What are the signs or symptoms? Symptoms of this condition include: A tingling feeling in your fingers, especially in your thumb, index, and middle fingers. Tingling or numbness in your hand. An aching feeling in your entire arm, especially when your wrist and elbow are bent for a long time. Wrist pain that goes up your arm to your shoulder. Pain that goes down into your palm or fingers. A weak feeling in your hands. You may have trouble grabbing and holding items. Your symptoms may feel worse during the night. How is this diagnosed? This  condition is diagnosed with a medical history and physical exam. You may also have tests, including: Electromyogram (EMG). This test measures electrical signals sent by your nerves into the muscles. Nerve conduction study. This test measures how well electrical signals pass through your nerves. Imaging tests, such as X-rays, ultrasound, and MRI. These tests check for possible causes of your condition. How is this treated? This condition may be treated with: Lifestyle changes. It is important to stop or change the activity that caused your condition. Doing exercise and activities to strengthen and stretch your muscles and tendons (physical therapy). Making lifestyle changes to help with your condition and learning how to do your daily activities safely (occupational therapy). Medicines for pain and inflammation. This may include medicine that is injected into your wrist. A wrist splint or brace. Surgery. Follow these instructions at home: If you have a splint or brace: Wear the splint or brace as told by your health care provider. Remove it only as told by your health care provider. Loosen the splint or brace if your fingers tingle, become numb, or turn cold and blue. Keep the splint or brace clean. If the splint or brace is not waterproof: Do not let it get wet. Cover it with a watertight covering when you take a bath or shower. Managing pain, stiffness, and swelling If directed, put ice on the painful area. To do this: If you have a removeable splint or brace, remove it as told by your health care provider. Put ice in a plastic bag. Place a towel between your skin and the bag or  between the splint or brace and the bag. Leave the ice on for 20 minutes, 2-3 times a day. Do not fall asleep with the cold pack on your skin. Remove the ice if your skin turns bright red. This is very important. If you cannot feel pain, heat, or cold, you have a greater risk of damage to the area. Move your  fingers often to reduce stiffness and swelling. General instructions Take over-the-counter and prescription medicines only as told by your health care provider. Rest your wrist and hand from any activity that may be causing your pain. If your condition is work related, talk with your employer about changes that can be made, such as getting a wrist pad to use while typing. Do any exercises as told by your health care provider, physical therapist, or occupational therapist. Keep all follow-up visits. This is important. Contact a health care provider if: You have new symptoms. Your pain is not controlled with medicines. Your symptoms get worse. Get help right away if: You have severe numbness or tingling in your wrist or hand. Summary Carpal tunnel syndrome is a condition that causes pain, numbness, and weakness in your hand and fingers. It is usually caused by repeated wrist motions. Lifestyle changes and medicines are used to treat carpal tunnel syndrome. Surgery may be recommended. Follow your health care provider's instructions about wearing a splint, resting from activity, keeping follow-up visits, and calling for help. This information is not intended to replace advice given to you by your health care provider. Make sure you discuss any questions you have with your health care provider. Document Revised: 02/10/2020 Document Reviewed: 02/10/2020 Elsevier Patient Education  2024 [employer].

## 2023-07-16 NOTE — Assessment & Plan Note (Signed)
Chronic, stable.  Continue losartan 25 mg daily, amlodipine 10 mg daily

## 2023-07-16 NOTE — Progress Notes (Signed)
Assessment & Plan:  Hypertension, unspecified type Assessment & Plan: Chronic, stable.  Continue losartan 25 mg daily, amlodipine 10 mg daily  Orders: -     Comprehensive metabolic panel -     TSH  Encounter for lipid screening for cardiovascular disease -     Lipid panel  History of vitamin D deficiency -     VITAMIN D 25 Hydroxy (Vit-D Deficiency, Fractures)  Atherosclerosis of aorta (HCC) Assessment & Plan: Chronic, symptomatically stable.  Continue Crestor 5 mg daily  Orders: -     Hemoglobin A1c -     CBC with Differential/Platelet -     Comprehensive metabolic panel -     Lipid panel  Bilateral hand pain Assessment & Plan: Presentation consistent with carpal tunnel syndrome and/or osteoarthritic changes.  Advised trial of Voltaren gel naprosyn prn,  icing regimen continued use of carpal tunnel brace at nighttime.  If no improvement patient and I agreed consult with Dr. Stephenie Acres would be an appropriate next step.  She will let me know how she is doing      Return precautions given.   Risks, benefits, and alternatives of the medications and treatment plan prescribed today were discussed, and patient expressed understanding.   Education regarding symptom management and diagnosis given to patient on AVS either electronically or printed.  Return in about 6 months (around 01/14/2024) for Complete Physical Exam.  Jill Plowman, FNP  Subjective:    Patient ID: Jill Arellano, female    DOB: 1951-09-02, 72 y.o.   MRN: [id number]  CC: Jill Arellano is a 72 y.o. female who presents today for follow up.   HPI: She continues to work 60 hours per week at the [employer] and thoroughly enjoys it.   She is making hush puppies, squeezing bag in right hand  Complains of bilateral hand pain, numbness across whole hand.  She is using brace at night for CTS.   Occasionally at night, the whole right hand will be numb.  No rash, joint swelling, skin wounds, neck  pain.       Allergies: Patient has no known allergies. Current Outpatient Medications on File Prior to Visit  Medication Sig Dispense Refill   amLODipine (NORVASC) 10 MG tablet Take 1 tablet (10 mg total) by mouth daily. 90 tablet 3   APPLE CIDER VINEGAR PO Take by mouth daily.     Calcium Carbonate (CALCIUM 600 PO) Take by mouth daily at 12 noon.     cholecalciferol (VITAMIN D3) 10 MCG (400 UNIT) TABS tablet Take 800 Units by mouth daily.      losartan (COZAAR) 25 MG tablet TAKE ONE TABLET BY MOUTH DAILY 90 tablet 1   Omega-3 Fatty Acids (FISH OIL) 1000 MG CAPS Take by mouth daily.     rosuvastatin (CRESTOR) 5 MG tablet Take 1 tablet (5 mg total) by mouth daily. 90 tablet 2   No current facility-administered medications on file prior to visit.    Review of Systems  Constitutional:  Negative for chills and fever.  Respiratory:  Negative for cough.   Cardiovascular:  Negative for chest pain and palpitations.  Gastrointestinal:  Negative for nausea and vomiting.  Musculoskeletal:  Positive for arthralgias.  Neurological:  Positive for numbness.      Objective:    BP 110/72   Pulse (!) 54   Temp 98.2 F (36.8 C) (Oral)   Ht 5\' 1"  (1.549 m)   Wt 120 lb 3.2 oz (54.5 kg)  SpO2 98%   BMI 22.71 kg/m  BP Readings from Last 3 Encounters:  07/16/23 110/72  01/08/23 128/72  07/10/22 130/70   Wt Readings from Last 3 Encounters:  07/16/23 120 lb 3.2 oz (54.5 kg)  01/24/23 119 lb 3.2 oz (54.1 kg)  01/08/23 124 lb 9.6 oz (56.5 kg)    Physical Exam Vitals reviewed.  Constitutional:      Appearance: She is well-developed.  Eyes:     Conjunctiva/sclera: Conjunctivae normal.  Cardiovascular:     Rate and Rhythm: Normal rate and regular rhythm.     Pulses: Normal pulses.     Heart sounds: Normal heart sounds.  Pulmonary:     Effort: Pulmonary effort is normal.     Breath sounds: Normal breath sounds. No wheezing, rhonchi or rales.  Musculoskeletal:     Right wrist: No  swelling. Normal range of motion. Normal pulse.     Left wrist: No swelling. Normal range of motion. Normal pulse.     Right hand: No swelling or bony tenderness. Normal range of motion. Normal pulse.     Left hand: No swelling or bony tenderness. Normal range of motion. Normal pulse.     Cervical back: No swelling or bony tenderness. No pain with movement.     Comments: Negative Tinel and phalen test. Grip strength equal bilaterally   Skin:    General: Skin is warm and dry.  Neurological:     Mental Status: She is alert.  Psychiatric:        Speech: Speech normal.        Behavior: Behavior normal.        Thought Content: Thought content normal.

## 2023-07-16 NOTE — Assessment & Plan Note (Signed)
Chronic, symptomatically stable.  Continue Crestor 5 mg daily

## 2023-07-16 NOTE — Assessment & Plan Note (Signed)
Presentation consistent with carpal tunnel syndrome and/or osteoarthritic changes.  Advised trial of Voltaren gel naprosyn prn,  icing regimen continued use of carpal tunnel brace at nighttime.  If no improvement patient and I agreed consult with Dr. Stephenie Acres would be an appropriate next step.  She will let me know how she is doing

## 2023-09-01 DIAGNOSIS — H52223 Regular astigmatism, bilateral: Secondary | ICD-10-CM | POA: Diagnosis not present

## 2023-09-01 DIAGNOSIS — Z961 Presence of intraocular lens: Secondary | ICD-10-CM | POA: Diagnosis not present

## 2023-09-01 DIAGNOSIS — H5203 Hypermetropia, bilateral: Secondary | ICD-10-CM | POA: Diagnosis not present

## 2023-09-01 DIAGNOSIS — Z135 Encounter for screening for eye and ear disorders: Secondary | ICD-10-CM | POA: Diagnosis not present

## 2023-09-01 DIAGNOSIS — H524 Presbyopia: Secondary | ICD-10-CM | POA: Diagnosis not present

## 2023-09-24 ENCOUNTER — Encounter: Payer: Self-pay | Admitting: Nurse Practitioner

## 2023-09-24 ENCOUNTER — Ambulatory Visit: Payer: Medicare HMO | Attending: Nurse Practitioner | Admitting: Nurse Practitioner

## 2023-09-24 VITALS — BP 160/60 | HR 53 | Ht 61.0 in | Wt 125.0 lb

## 2023-09-24 DIAGNOSIS — I2510 Atherosclerotic heart disease of native coronary artery without angina pectoris: Secondary | ICD-10-CM

## 2023-09-24 DIAGNOSIS — R918 Other nonspecific abnormal finding of lung field: Secondary | ICD-10-CM

## 2023-09-24 DIAGNOSIS — I10 Essential (primary) hypertension: Secondary | ICD-10-CM

## 2023-09-24 DIAGNOSIS — E782 Mixed hyperlipidemia: Secondary | ICD-10-CM | POA: Diagnosis not present

## 2023-09-24 NOTE — Progress Notes (Signed)
Office Visit    Patient Name: Jill Arellano Date of Encounter: 09/24/2023  Primary Care Provider:  Allegra Grana, FNP Primary Cardiologist:  Lorine Bears, MD  Chief Complaint    72 y.o. female with a history of remote tobacco abuse, hypertension, hyperlipidemia, coronary calcifications with normal coronary arteries, and pulmonary nodules, who presents for follow-up related to HTN.   Past Medical History  Subjective   Past Medical History:  Diagnosis Date   Arthritis    hands   Chicken pox    Coronary artery calcification seen on CT scan    a. 05/2017 Cor Ca2+ noted on CT; b. 07/2017 ETT: 1mm inflat ST dep w/ exercise (5:22, HTN response); c. 08/2017 Cath: nl cors. EF 65%.   Diverticulosis    a. 10/2021 Colonoscopy - diverticulosis.   Hypertension    Vitamin D deficiency    Wears dentures    upper   Past Surgical History:  Procedure Laterality Date   CATARACT EXTRACTION, BILATERAL  07/2019   COLONOSCOPY WITH PROPOFOL N/A 08/09/2019   Procedure: COLONOSCOPY WITH PROPOFOL;  Surgeon: Pasty Spillers, MD;  Location: [facility] ENDOSCOPY;  Service: Endoscopy;  Laterality: N/A;   COLONOSCOPY WITH PROPOFOL N/A 10/22/2021   Procedure: COLONOSCOPY WITH PROPOFOL;  Surgeon: Toney Reil, MD;  Location: [facility] ENDOSCOPY;  Service: Endoscopy;  Laterality: N/A;   LEFT HEART CATH AND CORONARY ANGIOGRAPHY N/A 08/28/2017   Procedure: LEFT HEART CATH AND CORONARY ANGIOGRAPHY;  Surgeon: Runell Gess, MD;  Location: [facility] INVASIVE CV LAB;  Service: Cardiovascular;  Laterality: N/A;   TUBAL LIGATION      Allergies  No Known Allergies    History of Present Illness      72 y.o. y/o female with above past medical history including remote tobacco abuse, hypertension, hyperlipidemia, coronary calcifications, and pulmonary nodules. She was previously evaluated in cardiology clinic in October 2018 in the setting of coronary calcifications noted on surveillance CT scanning for pulm  pulmonary nodules. She reported mild exertional dyspnea at the time without any evidence of chest pain. She underwent exercise treadmill testing, which was abnormal with 1 mm inferolateral ST segment depression with exercise. She subsequently underwent diagnostic catheterization which showed normal coronary arteries and normal LV function.    Jill Arellano was last seen in cardiology clinic in February 2023, at which time she was hypertensive and amlodipine was increased to 10 mg daily.  Losartan 25 mg daily was subsequently added to her regimen.  Blood pressure was well-controlled at primary care visit in October 2024.  Over the past nearly 2 years, patient says she has done well.  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  She is on her feet most of that time and is very active.  She notes that walking at her own pace, she has no issues whatsoever and denies chest pain or dyspnea.  Further, she does not experience palpitations, PND, orthopnea, dizziness, syncope, edema, or early satiety.  She has lost 12 pounds since her last visit here in the setting of more exercise and adjusting her diet/increasing vegetables and salads.  Blood pressures at home typically run around 130 or less.  Today, she is hypertensive and she believes it is because she is anxious when she comes here. Objective  Home Medications    Current Outpatient Medications  Medication Sig Dispense Refill   amLODipine (NORVASC) 10 MG tablet Take  1 tablet (10 mg total) by mouth daily. 90 tablet 3   APPLE CIDER VINEGAR PO Take by mouth daily.     Calcium Carbonate (CALCIUM 600 PO) Take by mouth daily at 12 noon.     cholecalciferol (VITAMIN D3) 10 MCG (400 UNIT) TABS tablet Take 800 Units by mouth daily.      losartan (COZAAR) 25 MG tablet TAKE ONE TABLET BY MOUTH DAILY 90 tablet 1   Omega-3 Fatty Acids (FISH OIL) 1000 MG CAPS Take by mouth daily.     rosuvastatin  (CRESTOR) 5 MG tablet Take 1 tablet (5 mg total) by mouth daily. 90 tablet 2   No current facility-administered medications for this visit.     Physical Exam    VS:  BP (!) 158/60 (BP Location: Left Arm, Patient Position: Sitting, Cuff Size: Normal)   Pulse (!) 53   Ht 5\' 1"  (1.549 m)   Wt 125 lb (56.7 kg)   SpO2 98%   BMI 23.62 kg/m  , BMI Body mass index is 23.62 kg/m.     Vitals:   09/24/23 0806 09/24/23 0832  BP: (!) 158/60 (!) 160/60  Pulse: (!) 53   SpO2: 98%       GEN: Well nourished, well developed, in no acute distress. HEENT: normal. Neck: Supple, no JVD, carotid bruits, or masses. Cardiac: RRR, no murmurs, rubs, or gallops. No clubbing, cyanosis, edema.  Radials 2+/PT 2+ and equal bilaterally.  Respiratory:  Respirations regular and unlabored, clear to auscultation bilaterally. GI: Soft, nontender, nondistended, BS + x 4. MS: no deformity or atrophy. Skin: warm and dry, no rash. Neuro:  Strength and sensation are intact. Psych: Normal affect.  Accessory Clinical Findings    ECG personally reviewed by me today - EKG Interpretation Date/Time:  Wednesday September 24 2023 08:10:14 EST Ventricular Rate:  53 PR Interval:  152 QRS Duration:  72 QT Interval:  410 QTC Calculation: 384 R Axis:   49  Text Interpretation: Sinus bradycardia Confirmed by Nicolasa Ducking [phone]) on 09/24/2023 8:17:10 AM  - no acute changes.  Lab Results  Component Value Date   WBC 4.7 07/16/2023   HGB 13.6 07/16/2023   HCT 41.9 07/16/2023   MCV 94.6 07/16/2023   PLT 220.0 07/16/2023   Lab Results  Component Value Date   CREATININE 0.65 07/16/2023   BUN 15 07/16/2023   NA 139 07/16/2023   K 4.4 07/16/2023   CL 103 07/16/2023   CO2 30 07/16/2023   Lab Results  Component Value Date   ALT 19 07/16/2023   AST 16 07/16/2023   ALKPHOS 48 07/16/2023   BILITOT 0.5 07/16/2023   Lab Results  Component Value Date   CHOL 250 (H) 07/16/2023   HDL 152.90 07/16/2023    LDLCALC 83 07/16/2023   LDLDIRECT 113.0 07/26/2019   TRIG 72.0 07/16/2023   CHOLHDL 2 07/16/2023    Lab Results  Component Value Date   HGBA1C 5.1 07/16/2023   Lab Results  Component Value Date   TSH 2.36 07/16/2023       Assessment & Plan    1.  Primary hypertension: Blood pressure elevated today on 2 separate recordings.  She typically runs 130 or less at home and was 115 systolic at recent primary care visit.  She attributes today's pressures to feeling anxious about this visit.  She also notes that though she is very careful with her sodium intake and trying to adhere to a mostly vegetarian diet, recently, she has  been slipping and introducing more salt into her diet.  She plans to curb this.  We mutually agreed to have her continue to check her blood pressures at home and contact us if she is trending greater than 130, at which point we would plan to increase her losartan to 50 mg daily with a basic metabolic panel a week later.  2.  Coronary calcium noted on CT: This dates back to 2018, and was followed by stress testing which was abnormal, and then catheterization showing normal coronary arteries.  She remains very active, working 60 hours a week and staying on her feet throughout the day without symptoms or limitations.  Continue statin therapy.  3.  Hyperlipidemia: LDL of 83 in October.  Continue statin therapy.  4.  Pulmonary nodules: This is been followed by annual CTs, though she has not had a CT since February 2023.  Defer to primary care.  5.  Disposition: Patient will contact us if blood pressures are trending high at home.  Otherwise, she will continue to follow with primary care and follow-up here in 1 year.  Nicolasa Ducking, NP 09/24/2023, 8:30 AM

## 2023-09-24 NOTE — Patient Instructions (Signed)
Medication Instructions:  No changes *If you need a refill on your cardiac medications before your next appointment, please call your pharmacy*   Lab Work: None ordered If you have labs (blood work) drawn today and your tests are completely normal, you will receive your results only by: MyChart Message (if you have MyChart) OR A paper copy in the mail If you have any lab test that is abnormal or we need to change your treatment, we will call you to review the results.   Testing/Procedures: None ordered   Follow-Up: At [facility], you and your health needs are our priority.  As part of our continuing mission to provide you with exceptional heart care, we have created designated Provider Care Teams.  These Care Teams include your primary Cardiologist (physician) and Advanced Practice Providers (APPs -  Physician Assistants and Nurse Practitioners) who all work together to provide you with the care you need, when you need it.  We recommend signing up for the patient portal called "MyChart".  Sign up information is provided on this After Visit Summary.  MyChart is used to connect with patients for Virtual Visits (Telemedicine).  Patients are able to view lab/test results, encounter notes, upcoming appointments, etc.  Non-urgent messages can be sent to your provider as well.   To learn more about what you can do with MyChart, go to [email or web address].    Your next appointment:   12 month(s)  Provider:   Muhammad Arida, MD       

## 2024-01-27 ENCOUNTER — Ambulatory Visit: Payer: Medicare HMO

## 2024-01-27 VITALS — BP 124/80 | Ht 61.0 in | Wt 121.0 lb

## 2024-01-27 DIAGNOSIS — Z0000 Encounter for general adult medical examination without abnormal findings: Secondary | ICD-10-CM

## 2024-01-27 NOTE — Progress Notes (Signed)
 .Because this visit was a virtual/telehealth visit,  certain criteria was not obtained, such a blood pressure, CBG if applicable, and timed get up and go. Any medications not marked as "taking" were not mentioned during the medication reconciliation part of the visit. Any vitals not documented were not able to be obtained due to this being a telehealth visit or patient was unable to self-report a recent blood pressure reading due to a lack of equipment at home via telehealth. Vitals that have been documented are verbally provided by the patient.  Subjective:   Jill Arellano is a 73 y.o. who presents for a Medicare Wellness preventive visit.  Visit Complete: Virtual I connected with  Jill Arellano on 01/27/24 by a audio enabled telemedicine application and verified that I am speaking with the correct person using two identifiers.  Patient Location: Home  Provider Location: Home Office  I discussed the limitations of evaluation and management by telemedicine. The patient expressed understanding and agreed to proceed.  Vital Signs: Because this visit was a virtual/telehealth visit, some criteria may be missing or patient reported. Any vitals not documented were not able to be obtained and vitals that have been documented are patient reported.  VideoDeclined- This patient declined Librarian, academic. Therefore the visit was completed with audio only.  Persons Participating in Visit: Patient.  AWV Questionnaire: No: Patient Medicare AWV questionnaire was not completed prior to this visit.  Cardiac Risk Factors include: advanced age (>84men, >58 women);hypertension     Objective:    Today's Vitals   01/27/24 0906  BP: 124/80  Weight: 121 lb (54.9 kg)  Height: 5\' 1"  (1.549 m)   Body mass index is 22.86 kg/m.     01/27/2024    9:05 AM 01/24/2023    8:59 AM 01/14/2022   11:30 AM 10/22/2021    9:10 AM 07/17/2020    8:52 AM 08/09/2019    9:32 AM 12/29/2017     9:33 AM  Advanced Directives  Does Patient Have a Medical Advance Directive? No No Yes No Yes No Yes  Type of Surveyor, minerals;Living will  Healthcare Power of [place];Living will  Healthcare Power of [place];Living will  Does patient want to make changes to medical advance directive?   No - Patient declined  No - Patient declined  No - Patient declined  Copy of Healthcare Power of Attorney in Chart?   No - copy requested  No - copy requested  No - copy requested  Would patient like information on creating a medical advance directive? No - Patient declined No - Patient declined         Current Medications (verified) Outpatient Encounter Medications as of 01/27/2024  Medication Sig   amLODipine (NORVASC) 10 MG tablet Take 1 tablet (10 mg total) by mouth daily.   APPLE CIDER VINEGAR PO Take by mouth daily.   Calcium Carbonate (CALCIUM 600 PO) Take by mouth daily at 12 noon.   cholecalciferol (VITAMIN D3) 10 MCG (400 UNIT) TABS tablet Take 800 Units by mouth daily.    losartan (COZAAR) 25 MG tablet TAKE ONE TABLET BY MOUTH DAILY   Omega-3 Fatty Acids (FISH OIL) 1000 MG CAPS Take by mouth daily.   rosuvastatin (CRESTOR) 5 MG tablet Take 1 tablet (5 mg total) by mouth daily.   No facility-administered encounter medications on file as of 01/27/2024.    Allergies (verified) Patient has no known allergies.   History: Past Medical  History:  Diagnosis Date   Arthritis    hands   Chicken pox    Coronary artery calcification seen on CT scan    a. 05/2017 Cor Ca2+ noted on CT; b. 07/2017 ETT: 1mm inflat ST dep w/ exercise (5:22, HTN response); c. 08/2017 Cath: nl cors. EF 65%.   Diverticulosis    a. 10/2021 Colonoscopy - diverticulosis.   Hypertension    Vitamin D deficiency    Wears dentures    upper   Past Surgical History:  Procedure Laterality Date   CATARACT EXTRACTION, BILATERAL  07/2019   COLONOSCOPY WITH PROPOFOL N/A 08/09/2019   Procedure:  COLONOSCOPY WITH PROPOFOL;  Surgeon: Pasty Spillers, MD;  Location: [facility] ENDOSCOPY;  Service: Endoscopy;  Laterality: N/A;   COLONOSCOPY WITH PROPOFOL N/A 10/22/2021   Procedure: COLONOSCOPY WITH PROPOFOL;  Surgeon: Toney Reil, MD;  Location: [facility] ENDOSCOPY;  Service: Endoscopy;  Laterality: N/A;   LEFT HEART CATH AND CORONARY ANGIOGRAPHY N/A 08/28/2017   Procedure: LEFT HEART CATH AND CORONARY ANGIOGRAPHY;  Surgeon: Runell Gess, MD;  Location: [facility] INVASIVE CV LAB;  Service: Cardiovascular;  Laterality: N/A;   TUBAL LIGATION     Family History  Problem Relation Age of Onset   Heart disease Mother        CHF   Heart failure Mother    Osteoporosis Mother    Cancer Sister        lung   Heart disease Sister        stent placement   Heart failure Brother    Colon cancer Brother 79       colon   Suicidality Grandchild    Breast cancer Neg Hx    Social History   Socioeconomic History   Marital status: Single    Spouse name: Not on file   Number of children: Not on file   Years of education: Not on file   Highest education level: Associate degree: occupational, Scientist, product/process development, or vocational program  Occupational History   Not on file  Tobacco Use   Smoking status: Former    Current packs/day: 0.00    Average packs/day: 2.0 packs/day for 44.0 years (88.0 ttl pk-yrs)    Types: Cigarettes    Start date: 1964    Quit date: 2008    Years since quitting: 17.2   Smokeless tobacco: Former    Quit date: 08/10/1995  Vaping Use   Vaping status: Never Used  Substance and Sexual Activity   Alcohol use: Yes    Alcohol/week: 1.0 standard drink of alcohol    Types: 1 Standard drinks or equivalent per week    Comment: beer OCC   Drug use: No   Sexual activity: Never  Other Topics Concern   Not on file  Social History Narrative   Moved from [place] in 2013    Lives by herself   Pets: 2 dogs live inside   Children: 2, daughter (54) and son (41)    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    Works at [employer] 20-30 hrs a week for extra [employer]    Enjoys reading       sister passed away 2022/05/05; she was her best friend   Social Drivers of [employer] Strain: Medium Risk (01/27/2024)   Overall Financial Resource Strain (CARDIA)    Difficulty of Paying Living Expenses: Somewhat hard  Food Insecurity: No Food Insecurity (01/27/2024)   Hunger Vital Sign  Worried About Programme researcher, broadcasting/film/video in the Last Year: Never true    Ran Out of Food in the Last Year: Never true  Transportation Needs: No Transportation Needs (01/27/2024)   PRAPARE - Administrator, Civil Service (Medical): No    Lack of Transportation (Non-Medical): No  Physical Activity: Sufficiently Active (01/27/2024)   Exercise Vital Sign    Days of Exercise per Week: 4 days    Minutes of Exercise per Session: 60 min  Stress: No Stress Concern Present (01/27/2024)   [employer] of Occupational Health - Occupational Stress Questionnaire    Feeling of Stress : Not at all  Social Connections: Moderately Integrated (01/27/2024)   Social Connection and Isolation Panel [NHANES]    Frequency of Communication with Friends and Family: More than three times a week    Frequency of Social Gatherings with Friends and Family: More than three times a week    Attends Religious Services: More than 4 times per year    Active Member of [employer] or Organizations: Yes    Attends Engineer, structural: More than 4 times per year    Marital Status: Divorced    Tobacco Counseling Counseling given: Not Answered    Clinical Intake:  Pre-visit preparation completed: Yes  Pain : No/denies pain     BMI - recorded: 22.86 Nutritional Status: BMI of 19-24  Normal Nutritional Risks: None Diabetes: No  Lab Results  Component Value Date   HGBA1C 5.1 07/16/2023   HGBA1C 5.4 07/10/2022   HGBA1C 5.4 07/26/2019     How often do you need to have someone help you when you  read instructions, pamphlets, or other written materials from your doctor or pharmacy?: 1 - Never What is the last grade level you completed in school?: Associates Degree  Interpreter Needed?: No  Information entered by :: Juliann Ochoa   Activities of Daily Living     01/27/2024    7:29 AM  In your present state of health, do you have any difficulty performing the following activities:  Hearing? 0  Vision? 0  Difficulty concentrating or making decisions? 0  Walking or climbing stairs? 0  Dressing or bathing? 0  Doing errands, shopping? 0  Preparing Food and eating ? N  Using the Toilet? N  In the past six months, have you accidently leaked urine? N  Do you have problems with loss of bowel control? N  Managing your Medications? N  Managing your Finances? N  Housekeeping or managing your Housekeeping? N    Patient Care Team: Calista Catching, FNP as PCP - General (Family Medicine) Wenona Hamilton, MD as PCP - Cardiology (Cardiology)  Indicate any recent Medical Services you may have received from other than [facility] providers in the past year (date may be approximate).     Assessment:   This is a routine wellness examination for [place].  Hearing/Vision screen Hearing Screening - Comments:: Declines difficulties with hearing Vision Screening - Comments:: Patient wears glasses   Goals Addressed               This Visit's Progress     Weight goal 115lb (pt-stated)   On track     Stay active Healthy diet       Depression Screen     01/27/2024    9:12 AM 07/16/2023    8:07 AM 01/24/2023    8:57 AM 01/08/2023    8:24 AM 07/10/2022    8:05 AM 01/14/2022  11:23 AM 03/19/2021    9:14 AM  PHQ 2/9 Scores  PHQ - 2 Score 0 0 0 0 0 0 0  PHQ- 9 Score 2 0 0 0   1    Fall Risk     01/27/2024    7:29 AM 07/16/2023    8:07 AM 01/23/2023    8:34 AM 01/08/2023    8:24 AM 07/10/2022    8:05 AM  Fall Risk   Falls in the past year? 1 0 1 0 0  Number falls in past yr:  0 0 0 0 0  Injury with Fall? 0 0 0 0 0  Risk for fall due to : No Fall Risks No Fall Risks  No Fall Risks No Fall Risks  Follow up Falls evaluation completed Falls evaluation completed Falls evaluation completed;Falls prevention discussed Falls evaluation completed Falls evaluation completed    MEDICARE RISK AT HOME:  Medicare Risk at Home Any stairs in or around the home?: (Patient-Rptd) No If so, are there any without handrails?: (Patient-Rptd) No Home free of loose throw rugs in walkways, pet beds, electrical cords, etc?: (Patient-Rptd) Yes Adequate lighting in your home to reduce risk of falls?: (Patient-Rptd) Yes Life alert?: (Patient-Rptd) No Use of a cane, walker or w/c?: (Patient-Rptd) No Grab bars in the bathroom?: (Patient-Rptd) No Shower chair or bench in shower?: (Patient-Rptd) No Elevated toilet seat or a handicapped toilet?: (Patient-Rptd) No  TIMED UP AND GO:  Was the test performed?  No  Cognitive Function: 6CIT completed    12/27/2016    9:38 AM  MMSE - Mini Mental State Exam  Orientation to time 5  Orientation to Place 5  Registration 3  Attention/ Calculation 5  Recall 3  Language- name 2 objects 2  Language- repeat 1  Language- follow 3 step command 3  Language- read & follow direction 1  Write a sentence 1  Copy design 1  Total score 30        01/27/2024    9:08 AM 01/24/2023    9:00 AM  6CIT Screen  What Year? 0 points 0 points  What month? 0 points 0 points  What time? 0 points 0 points  Count back from 20 0 points 0 points  Months in reverse 0 points 0 points  Repeat phrase 0 points 0 points  Total Score 0 points 0 points    Immunizations Immunization History  Administered Date(s) Administered   Fluad Quad(high Dose 65+) 07/27/2019, 07/05/2022   Influenza Split 08/28/2010   Influenza, High Dose Seasonal PF 06/20/2023   Influenza-Unspecified 07/10/2020, 08/14/2020, 07/30/2021   Moderna Covid-19 Vaccine Bivalent Booster 44yrs & up  07/30/2021   PFIZER(Purple Top)SARS-COV-2 Vaccination 11/26/2019, 12/24/2019, 08/24/2020   Pfizer(Comirnaty)Fall Seasonal Vaccine 12 years and older 06/20/2023   Pneumococcal Conjugate-13 12/27/2016   Pneumococcal Polysaccharide-23 11/22/2014   Td 08/12/2020   Tdap 01/28/2011   Zoster Recombinant(Shingrix) 09/11/2020, 11/13/2020   Zoster, Live 02/14/2014    Screening Tests Health Maintenance  Topic Date Due   COVID-19 Vaccine (6 - 2024-25 season) 12/18/2023   INFLUENZA VACCINE  05/14/2024   Medicare Annual Wellness (AWV)  01/26/2025   MAMMOGRAM  03/23/2025   DTaP/Tdap/Td (3 - Td or Tdap) 08/12/2030   Colonoscopy  10/23/2031   Pneumonia Vaccine 57+ Years old  Completed   DEXA SCAN  Completed   Hepatitis C Screening  Completed   Zoster Vaccines- Shingrix  Completed   HPV VACCINES  Aged Out   Meningococcal B Vaccine  Aged  Out   Fecal DNA (Cologuard)  Discontinued    Health Maintenance  Health Maintenance Due  Topic Date Due   COVID-19 Vaccine (6 - 2024-25 season) 12/18/2023   Health Maintenance Items Addressed:   Additional Screening:  Vision Screening: Recommended annual ophthalmology exams for early detection of glaucoma and other disorders of the eye.  Dental Screening: Recommended annual dental exams for proper oral hygiene  Community Resource Referral / Chronic Care Management: CRR required this visit?  No   CCM required this visit?  No     Plan:     I have personally reviewed and noted the following in the patient's chart:   Medical and social history Use of alcohol, tobacco or illicit drugs  Current medications and supplements including opioid prescriptions. Patient is not currently taking opioid prescriptions. Functional ability and status Nutritional status Physical activity Advanced directives List of other physicians Hospitalizations, surgeries, and ER visits in previous 12 months Vitals Screenings to include cognitive, depression, and  falls Referrals and appointments  In addition, I have reviewed and discussed with patient certain preventive protocols, quality metrics, and best practice recommendations. A written personalized care plan for preventive services as well as general preventive health recommendations were provided to patient.     Freeda Jerry, [place]   01/27/2024   After Visit Summary: (MyChart) Due to this being a telephonic visit, the after visit summary with patients personalized plan was offered to patient via MyChart   Notes: Nothing significant to report at this time.

## 2024-01-27 NOTE — Patient Instructions (Signed)
 Jill Arellano , Thank you for taking time to come for your Medicare Wellness Visit. I appreciate your ongoing commitment to your health goals. Please review the following plan we discussed and let me know if I can assist you in the future.   Referrals/Orders/Follow-Ups/Clinician Recommendations: follow up in 1 year  This is a list of the screening recommended for you and due dates:  Health Maintenance  Topic Date Due   COVID-19 Vaccine (6 - 2024-25 season) 12/18/2023   Flu Shot  05/14/2024   Medicare Annual Wellness Visit  01/26/2025   Mammogram  03/23/2025   DTaP/Tdap/Td vaccine (3 - Td or Tdap) 08/12/2030   Colon Cancer Screening  10/23/2031   Pneumonia Vaccine  Completed   DEXA scan (bone density measurement)  Completed   Hepatitis C Screening  Completed   Zoster (Shingles) Vaccine  Completed   HPV Vaccine  Aged Out   Meningitis B Vaccine  Aged Out   Cologuard (Stool DNA test)  Discontinued    Advanced directives: (Declined) Advance directive discussed with you today. Even though you declined this today, please call our office should you change your mind, and we can give you the proper paperwork for you to fill out.  Next Medicare Annual Wellness Visit scheduled for next year: Yes

## 2024-02-04 ENCOUNTER — Ambulatory Visit (INDEPENDENT_AMBULATORY_CARE_PROVIDER_SITE_OTHER): Payer: Medicare HMO | Admitting: Family

## 2024-02-04 ENCOUNTER — Encounter: Payer: Self-pay | Admitting: Family

## 2024-02-04 VITALS — BP 122/78 | HR 67 | Temp 97.8°F | Ht 61.0 in | Wt 128.2 lb

## 2024-02-04 DIAGNOSIS — I2510 Atherosclerotic heart disease of native coronary artery without angina pectoris: Secondary | ICD-10-CM | POA: Diagnosis not present

## 2024-02-04 DIAGNOSIS — I10 Essential (primary) hypertension: Secondary | ICD-10-CM

## 2024-02-04 DIAGNOSIS — Z1231 Encounter for screening mammogram for malignant neoplasm of breast: Secondary | ICD-10-CM | POA: Diagnosis not present

## 2024-02-04 DIAGNOSIS — Z0000 Encounter for general adult medical examination without abnormal findings: Secondary | ICD-10-CM | POA: Diagnosis not present

## 2024-02-04 MED ORDER — ROSUVASTATIN CALCIUM 5 MG PO TABS: 5.0000 mg | ORAL_TABLET | Freq: Every day | ORAL | 3 refills | Status: DC

## 2024-02-04 MED ORDER — LOSARTAN POTASSIUM 25 MG PO TABS: 25.0000 mg | ORAL_TABLET | Freq: Every day | ORAL | 3 refills | Status: AC

## 2024-02-04 MED ORDER — AMLODIPINE BESYLATE 10 MG PO TABS: 10.0000 mg | ORAL_TABLET | Freq: Every day | ORAL | 3 refills | Status: AC

## 2024-02-04 NOTE — Assessment & Plan Note (Signed)
 Deferred clinical breast exam due to patient preference.  Encouraged self breast exam at home.  Patient will schedule mammogram.  Deferred pelvic exam in the absence of complaints and patient is no longer screening for cervical cancer.  She is no longer screening for lung cancer as it has been more than 15 years from smoking.

## 2024-02-04 NOTE — Assessment & Plan Note (Signed)
Chronic, stable.  Continue losartan 25 mg daily, amlodipine 10 mg daily

## 2024-02-04 NOTE — Progress Notes (Signed)
 Assessment & Plan:  Routine general medical examination at a health care facility Assessment & Plan: Deferred clinical breast exam due to patient preference.  Encouraged self breast exam at home.  Patient will schedule mammogram.  Deferred pelvic exam in the absence of complaints and patient is no longer screening for cervical cancer.  She is no longer screening for lung cancer as it has been more than 15 years from smoking.   Hypertension, unspecified type Assessment & Plan: Chronic, stable.  Continue losartan  25 mg daily, amlodipine  10 mg daily  Orders: -     Losartan  Potassium; Take 1 tablet (25 mg total) by mouth daily.  Dispense: 90 tablet; Refill: 3 -     amLODIPine  Besylate; Take 1 tablet (10 mg total) by mouth daily.  Dispense: 90 tablet; Refill: 3  Encounter for screening mammogram for malignant neoplasm of breast -     3D Screening Mammogram, Left and Right; Future  Coronary artery disease involving native heart without angina pectoris, unspecified vessel or lesion type -     Rosuvastatin  Calcium ; Take 1 tablet (5 mg total) by mouth daily.  Dispense: 90 tablet; Refill: 3     Return precautions given.   Risks, benefits, and alternatives of the medications and treatment plan prescribed today were discussed, and patient expressed understanding.   Education regarding symptom management and diagnosis given to patient on AVS either electronically or printed.  Return in about 6 months (around 08/05/2024).  Bascom Bossier, FNP  Subjective:    Patient ID: Jill Arellano, female    DOB: 06/24/1951, 73 y.o.   MRN: [id number]  CC: Jill Arellano is a 73 y.o. female who presents today for physical exam.    HPI: Feels well today No new complaints She continues to enjoy working   She remains compliant with losartan , amlodipine .  Denies chest pain, shortness of breath  Colorectal Cancer Screening:  UTD , Dr. Baldomero Bone, 10/22/2021, repeat in 10-year  Breast Cancer Screening:  Mammogram UTD Cervical Cancer Screening: She is no longer screening for cervical cancer.  Last Pap was 5 years ago negative HPV,NIL.  Denies pelvic pain, vaginal bleeding  Bone Health screening/DEXA for 65+: UTD, osteopenia  Lung Cancer Screening:  more than 15 years have passed since smoking, she no longer qualify for CT chest screen  [place]  AAA screen negative 03/03/23       Tetanus - UTD        Pneumococcal - Complete Exercise: Gets regular exercise , working in [employer].   Alcohol use:  occassional Smoking/tobacco use: former smoker.    Health Maintenance  Topic Date Due   COVID-19 Vaccine (6 - 2024-25 season) 12/18/2023   Flu Shot  05/14/2024   Medicare Annual Wellness Visit  01/26/2025   Mammogram  03/23/2025   DTaP/Tdap/Td vaccine (3 - Td or Tdap) 08/12/2030   Colon Cancer Screening  10/23/2031   Pneumonia Vaccine  Completed   DEXA scan (bone density measurement)  Completed   Hepatitis C Screening  Completed   Zoster (Shingles) Vaccine  Completed   HPV Vaccine  Aged Out   Meningitis B Vaccine  Aged Out   Cologuard (Stool DNA test)  Discontinued    ALLERGIES: Patient has no known allergies.  Current Outpatient Medications on File Prior to Visit  Medication Sig Dispense Refill   APPLE CIDER VINEGAR PO Take by mouth daily.     Calcium  Carbonate (CALCIUM  600 PO) Take by mouth daily at 12 noon.  cholecalciferol (VITAMIN D3) 10 MCG (400 UNIT) TABS tablet Take 800 Units by mouth daily.      Omega-3 Fatty Acids (FISH OIL) 1000 MG CAPS Take by mouth daily.     No current facility-administered medications on file prior to visit.    Review of Systems  Constitutional:  Negative for chills, fever and unexpected weight change.  HENT:  Negative for congestion.   Respiratory:  Negative for cough.   Cardiovascular:  Negative for chest pain, palpitations and leg swelling.  Gastrointestinal:  Negative for nausea and vomiting.  Musculoskeletal:  Negative for arthralgias and  myalgias.  Skin:  Negative for rash.  Neurological:  Negative for headaches.  Hematological:  Negative for adenopathy.  Psychiatric/Behavioral:  Negative for confusion.       Objective:    BP 122/78   Pulse 67   Temp 97.8 F (36.6 C) (Oral)   Ht 5\' 1"  (1.549 m)   Wt 128 lb 3.2 oz (58.2 kg)   SpO2 99%   BMI 24.22 kg/m   BP Readings from Last 3 Encounters:  02/04/24 122/78  01/27/24 124/80  09/24/23 (!) 160/60   Wt Readings from Last 3 Encounters:  02/04/24 128 lb 3.2 oz (58.2 kg)  01/27/24 121 lb (54.9 kg)  09/24/23 125 lb (56.7 kg)    Physical Exam Vitals reviewed.  Constitutional:      Appearance: Normal appearance. She is well-developed.  Eyes:     Conjunctiva/sclera: Conjunctivae normal.  Neck:     Thyroid : No thyroid  mass or thyromegaly.  Cardiovascular:     Rate and Rhythm: Normal rate and regular rhythm.     Pulses: Normal pulses.     Heart sounds: Normal heart sounds.  Pulmonary:     Effort: Pulmonary effort is normal.     Breath sounds: Normal breath sounds. No wheezing, rhonchi or rales.  Abdominal:     General: Bowel sounds are normal. There is no distension.     Palpations: Abdomen is soft. Abdomen is not rigid. There is no fluid wave or mass.     Tenderness: There is no abdominal tenderness. There is no guarding or rebound.  Lymphadenopathy:     Head:     Right side of head: No submental, submandibular, tonsillar, preauricular, posterior auricular or occipital adenopathy.     Left side of head: No submental, submandibular, tonsillar, preauricular, posterior auricular or occipital adenopathy.     Cervical: No cervical adenopathy.  Skin:    General: Skin is warm and dry.  Neurological:     Mental Status: She is alert.  Psychiatric:        Speech: Speech normal.        Behavior: Behavior normal.        Thought Content: Thought content normal.

## 2024-02-04 NOTE — Patient Instructions (Signed)
 Health Maintenance for Postmenopausal Women Menopause is a normal process in which your ability to get pregnant comes to an end. This process happens slowly over many months or years, usually between the ages of 24 and 62. Menopause is complete when you have missed your menstrual period for 12 months. It is important to talk with your health care provider about some of the most common conditions that affect women after menopause (postmenopausal women). These include heart disease, cancer, and bone loss (osteoporosis). Adopting a healthy lifestyle and getting preventive care can help to promote your health and wellness. The actions you take can also lower your chances of developing some of these common conditions. What are the signs and symptoms of menopause? During menopause, you may have the following symptoms: Hot flashes. These can be moderate or severe. Night sweats. Decrease in sex drive. Mood swings. Headaches. Tiredness (fatigue). Irritability. Memory problems. Problems falling asleep or staying asleep. Talk with your health care provider about treatment options for your symptoms. Do I need hormone replacement therapy? Hormone replacement therapy is effective in treating symptoms that are caused by menopause, such as hot flashes and night sweats. Hormone replacement carries certain risks, especially as you become older. If you are thinking about using estrogen or estrogen with progestin, discuss the benefits and risks with your health care provider. How can I reduce my risk for heart disease and stroke? The risk of heart disease, heart attack, and stroke increases as you age. One of the causes may be a change in the body's hormones during menopause. This can affect how your body uses dietary fats, triglycerides, and cholesterol. Heart attack and stroke are medical emergencies. There are many things that you can do to help prevent heart disease and stroke. Watch your blood pressure High  blood pressure causes heart disease and increases the risk of stroke. This is more likely to develop in people who have high blood pressure readings or are overweight. Have your blood pressure checked: Every 3-5 years if you are 50-75 years of age. Every year if you are 77 years old or older. Eat a healthy diet  Eat a diet that includes plenty of vegetables, fruits, low-fat dairy products, and lean protein. Do not eat a lot of foods that are high in solid fats, added sugars, or sodium. Get regular exercise Get regular exercise. This is one of the most important things you can do for your health. Most adults should: Try to exercise for at least 150 minutes each week. The exercise should increase your heart rate and make you sweat (moderate-intensity exercise). Try to do strengthening exercises at least twice each week. Do these in addition to the moderate-intensity exercise. Spend less time sitting. Even light physical activity can be beneficial. Other tips Work with your health care provider to achieve or maintain a healthy weight. Do not use any products that contain nicotine or tobacco. These products include cigarettes, chewing tobacco, and vaping devices, such as e-cigarettes. If you need help quitting, ask your health care provider. Know your numbers. Ask your health care provider to check your cholesterol and your blood sugar (glucose). Continue to have your blood tested as directed by your health care provider. Do I need screening for cancer? Depending on your health history and family history, you may need to have cancer screenings at different stages of your life. This may include screening for: Breast cancer. Cervical cancer. Lung cancer. Colorectal cancer. What is my risk for osteoporosis? After menopause, you may be  at increased risk for osteoporosis. Osteoporosis is a condition in which bone destruction happens more quickly than new bone creation. To help prevent osteoporosis or  the bone fractures that can happen because of osteoporosis, you may take the following actions: If you are 61-3 years old, get at least 1,000 mg of calcium and at least 600 international units (IU) of vitamin D per day. If you are older than age 61 but younger than age 75, get at least 1,200 mg of calcium and at least 600 international units (IU) of vitamin D per day. If you are older than age 62, get at least 1,200 mg of calcium and at least 800 international units (IU) of vitamin D per day. Smoking and drinking excessive alcohol increase the risk of osteoporosis. Eat foods that are rich in calcium and vitamin D, and do weight-bearing exercises several times each week as directed by your health care provider. How does menopause affect my mental health? Depression may occur at any age, but it is more common as you become older. Common symptoms of depression include: Feeling depressed. Changes in sleep patterns. Changes in appetite or eating patterns. Feeling an overall lack of motivation or enjoyment of activities that you previously enjoyed. Frequent crying spells. Talk with your health care provider if you think that you are experiencing any of these symptoms. General instructions See your health care provider for regular wellness exams and vaccines. This may include: Scheduling regular health, dental, and eye exams. Getting and maintaining your vaccines. These include: Influenza vaccine. Get this vaccine each year before the flu season begins. Pneumonia vaccine. Shingles vaccine. Tetanus, diphtheria, and pertussis (Tdap) booster vaccine. Your health care provider may also recommend other immunizations. Tell your health care provider if you have ever been abused or do not feel safe at home. Summary Menopause is a normal process in which your ability to get pregnant comes to an end. This condition causes hot flashes, night sweats, decreased interest in sex, mood swings, headaches, or lack  of sleep. Treatment for this condition may include hormone replacement therapy. Take actions to keep yourself healthy, including exercising regularly, eating a healthy diet, watching your weight, and checking your blood pressure and blood sugar levels. Get screened for cancer and depression. Make sure that you are up to date with all your vaccines. This information is not intended to replace advice given to you by your health care provider. Make sure you discuss any questions you have with your health care provider. Document Revised: 02/19/2021 Document Reviewed: 02/19/2021 Elsevier Patient Education  2024 [employer].

## 2024-03-31 ENCOUNTER — Ambulatory Visit
Admission: RE | Admit: 2024-03-31 | Discharge: 2024-03-31 | Disposition: A | Discharge status: Home / Self Care | Source: Ambulatory Visit | Attending: Family | Admitting: Family

## 2024-03-31 DIAGNOSIS — Z1231 Encounter for screening mammogram for malignant neoplasm of breast: Secondary | ICD-10-CM | POA: Diagnosis not present

## 2024-04-01 ENCOUNTER — Other Ambulatory Visit: Payer: Self-pay | Admitting: Family

## 2024-04-01 DIAGNOSIS — R928 Other abnormal and inconclusive findings on diagnostic imaging of breast: Secondary | ICD-10-CM

## 2024-04-02 ENCOUNTER — Ambulatory Visit: Payer: Self-pay | Admitting: Family

## 2024-04-05 NOTE — Telephone Encounter (Signed)
 Spoke to pt she is going to call Jill Arellano and see if she needs a biopsy and she will let us  know what they said

## 2024-04-07 ENCOUNTER — Ambulatory Visit
Admission: RE | Admit: 2024-04-07 | Discharge: 2024-04-07 | Disposition: A | Discharge status: Home / Self Care | Source: Ambulatory Visit | Attending: Family | Admitting: Family

## 2024-04-07 DIAGNOSIS — R928 Other abnormal and inconclusive findings on diagnostic imaging of breast: Secondary | ICD-10-CM | POA: Diagnosis not present

## 2024-04-07 DIAGNOSIS — R92322 Mammographic fibroglandular density, left breast: Secondary | ICD-10-CM | POA: Diagnosis not present

## 2024-04-09 ENCOUNTER — Ambulatory Visit
Admission: RE | Admit: 2024-04-09 | Discharge: 2024-04-09 | Disposition: A | Discharge status: Home / Self Care | Source: Ambulatory Visit | Attending: Family | Admitting: Family

## 2024-04-09 ENCOUNTER — Ambulatory Visit
Admission: RE | Admit: 2024-04-09 | Discharge: 2024-04-09 | Disposition: A | Discharge status: Home / Self Care | Source: Ambulatory Visit | Attending: Family

## 2024-04-09 DIAGNOSIS — R928 Other abnormal and inconclusive findings on diagnostic imaging of breast: Secondary | ICD-10-CM | POA: Insufficient documentation

## 2024-04-09 DIAGNOSIS — N6012 Diffuse cystic mastopathy of left breast: Secondary | ICD-10-CM | POA: Insufficient documentation

## 2024-04-09 DIAGNOSIS — R921 Mammographic calcification found on diagnostic imaging of breast: Secondary | ICD-10-CM | POA: Diagnosis not present

## 2024-04-09 HISTORY — PX: BREAST BIOPSY: SHX20

## 2024-04-09 MED ORDER — LIDOCAINE 1 % OPTIME INJ - NO CHARGE
5.0000 mL | Freq: Once | INTRAMUSCULAR | Status: AC
  Administered 2024-04-09: 5 mL
  Filled 2024-04-09: qty 6

## 2024-04-09 MED ORDER — LIDOCAINE-EPINEPHRINE 1 %-1:100000 IJ SOLN
20.0000 mL | Freq: Once | INTRAMUSCULAR | Status: AC
  Administered 2024-04-09: 20 mL
  Filled 2024-04-09: qty 20

## 2024-04-12 LAB — SURGICAL PATHOLOGY

## 2024-08-06 ENCOUNTER — Ambulatory Visit (INDEPENDENT_AMBULATORY_CARE_PROVIDER_SITE_OTHER): Admitting: Family

## 2024-08-06 ENCOUNTER — Encounter: Payer: Self-pay | Admitting: Family

## 2024-08-06 VITALS — BP 130/78 | HR 65 | Temp 98.0°F | Wt 133.2 lb

## 2024-08-06 DIAGNOSIS — E559 Vitamin D deficiency, unspecified: Secondary | ICD-10-CM | POA: Diagnosis not present

## 2024-08-06 DIAGNOSIS — I700 Atherosclerosis of aorta: Secondary | ICD-10-CM

## 2024-08-06 DIAGNOSIS — I10 Essential (primary) hypertension: Secondary | ICD-10-CM

## 2024-08-06 DIAGNOSIS — Z23 Encounter for immunization: Secondary | ICD-10-CM

## 2024-08-06 LAB — COMPREHENSIVE METABOLIC PANEL WITH GFR
ALT: 16 U/L (ref 0–35)
AST: 16 U/L (ref 0–37)
Albumin: 4.8 g/dL (ref 3.5–5.2)
Alkaline Phosphatase: 51 U/L (ref 39–117)
BUN: 22 mg/dL (ref 6–23)
CO2: 28 meq/L (ref 19–32)
Calcium: 9.5 mg/dL (ref 8.4–10.5)
Chloride: 101 meq/L (ref 96–112)
Creatinine, Ser: 0.73 mg/dL (ref 0.40–1.20)
GFR: 80.65 mL/min (ref >60.00)
Glucose, Bld: 89 mg/dL (ref 70–99)
Potassium: 5.1 meq/L (ref 3.5–5.1)
Sodium: 138 meq/L (ref 135–145)
Total Bilirubin: 0.4 mg/dL (ref 0.2–1.2)
Total Protein: 7 g/dL (ref 6.0–8.3)

## 2024-08-06 LAB — CBC WITH DIFFERENTIAL/PLATELET
Basophils Absolute: 0 K/uL (ref 0.0–0.1)
Basophils Relative: 0.4 % (ref 0.0–3.0)
Eosinophils Absolute: 0 K/uL (ref 0.0–0.7)
Eosinophils Relative: 1 % (ref 0.0–5.0)
HCT: 40.5 % (ref 36.0–46.0)
Hemoglobin: 13.5 g/dL (ref 12.0–15.0)
Lymphocytes Relative: 42.7 % (ref 12.0–46.0)
Lymphs Abs: 2.2 K/uL (ref 0.7–4.0)
MCHC: 33.2 g/dL (ref 30.0–36.0)
MCV: 92.7 fl (ref 78.0–100.0)
Monocytes Absolute: 0.5 K/uL (ref 0.1–1.0)
Monocytes Relative: 9.8 % (ref 3.0–12.0)
Neutro Abs: 2.4 K/uL (ref 1.4–7.7)
Neutrophils Relative %: 46.1 % (ref 43.0–77.0)
Platelets: 232 K/uL (ref 150.0–400.0)
RBC: 4.37 Mil/uL (ref 3.87–5.11)
RDW: 12.1 % (ref 11.5–15.5)
WBC: 5.1 K/uL (ref 4.0–10.5)

## 2024-08-06 LAB — LIPID PANEL
Cholesterol: 244 mg/dL — ABNORMAL HIGH (ref 0–200)
HDL: 132.7 mg/dL (ref >39.00)
LDL Cholesterol: 99 mg/dL (ref 0–99)
NonHDL: 111.1
Total CHOL/HDL Ratio: 2
Triglycerides: 62 mg/dL (ref 0.0–149.0)
VLDL: 12.4 mg/dL (ref 0.0–40.0)

## 2024-08-06 LAB — VITAMIN D 25 HYDROXY (VIT D DEFICIENCY, FRACTURES): VITD: 31.85 ng/mL (ref 30.00–100.00)

## 2024-08-06 LAB — HEMOGLOBIN A1C: Hgb A1c MFr Bld: 5.3 % (ref 4.6–6.5)

## 2024-08-06 LAB — TSH: TSH: 1.86 u[IU]/mL (ref 0.35–5.50)

## 2024-08-06 NOTE — Progress Notes (Signed)
 Assessment & Plan:  Hypertension, unspecified type Assessment & Plan: Chronic, stable.  Continue losartan  25 mg daily, amlodipine  10 mg daily  Orders: -     Hemoglobin A1c -     CBC with Differential/Platelet -     Comprehensive metabolic panel with GFR -     TSH  Vitamin D  deficiency -     VITAMIN D  25 Hydroxy (Vit-D Deficiency, Fractures)  Atherosclerosis of aorta Assessment & Plan: Chronic, symptomatically stable.  Continue Crestor  5 mg daily.Pending lipid panel.   Orders: -     Lipid panel  Encounter for administration of vaccine -     Flu vaccine HIGH DOSE PF(Fluzone Trivalent)     Return precautions given.   Risks, benefits, and alternatives of the medications and treatment plan prescribed today were discussed, and patient expressed understanding.   Education regarding symptom management and diagnosis given to patient on AVS either electronically or printed.  Return for Complete Physical Exam.  Rollene Northern, FNP  Subjective:    Patient ID: MYSTIC LABO, female    DOB: 1951-03-03, 73 y.o.   MRN: [id number]  CC: KIYANNA BIEGLER is a 73 y.o. female who presents today for follow up.   HPI: HPI Discussed the use of AI scribe software for clinical note transcription with the patient, who gave verbal consent to proceed.  History of Present Illness   CHERAE MARTON is a 73 year old female who presents for a follow-up visit  She recently underwent a mammogram that led to a callback and subsequent biopsy, revealing benign fibrocystic changes with focal calcifications. This was her first breast biopsy. She initially felt anxious about the findings but is reassured by her daughter's support.  She celebrated her 73th birthday earlier this month and indulged in sweets, expressing concern about her blood sugar levels.   Denies CP, sob.       Allergies: Patient has no known allergies. Current Outpatient Medications on File Prior to Visit  Medication Sig  Dispense Refill   amLODipine  (NORVASC ) 10 MG tablet Take 1 tablet (10 mg total) by mouth daily. 90 tablet 3   APPLE CIDER VINEGAR PO Take by mouth daily.     Calcium  Carbonate (CALCIUM  600 PO) Take by mouth daily at 12 noon.     cholecalciferol (VITAMIN D3) 10 MCG (400 UNIT) TABS tablet Take 800 Units by mouth daily.      losartan  (COZAAR ) 25 MG tablet Take 1 tablet (25 mg total) by mouth daily. 90 tablet 3   Omega-3 Fatty Acids (FISH OIL) 1000 MG CAPS Take by mouth daily.     rosuvastatin  (CRESTOR ) 5 MG tablet Take 1 tablet (5 mg total) by mouth daily. 90 tablet 3   No current facility-administered medications on file prior to visit.    Review of Systems  Constitutional:  Negative for chills and fever.  Respiratory:  Negative for cough.   Cardiovascular:  Negative for chest pain and palpitations.  Gastrointestinal:  Negative for nausea and vomiting.      Objective:    BP 130/78   Pulse 65   Temp 98 F (36.7 C) (Oral)   Wt 133 lb 3.2 oz (60.4 kg)   SpO2 99%   BMI 25.17 kg/m  BP Readings from Last 3 Encounters:  08/06/24 130/78  02/04/24 122/78  01/27/24 124/80   Wt Readings from Last 3 Encounters:  08/06/24 133 lb 3.2 oz (60.4 kg)  02/04/24 128 lb 3.2 oz (58.2 kg)  01/27/24  121 lb (54.9 kg)    Physical Exam Vitals reviewed.  Constitutional:      Appearance: She is well-developed.  Eyes:     Conjunctiva/sclera: Conjunctivae normal.  Cardiovascular:     Rate and Rhythm: Normal rate and regular rhythm.     Pulses: Normal pulses.     Heart sounds: Normal heart sounds.  Pulmonary:     Effort: Pulmonary effort is normal.     Breath sounds: Normal breath sounds. No wheezing, rhonchi or rales.  Skin:    General: Skin is warm and dry.  Neurological:     Mental Status: She is alert.  Psychiatric:        Speech: Speech normal.        Behavior: Behavior normal.        Thought Content: Thought content normal.

## 2024-08-06 NOTE — Assessment & Plan Note (Signed)
Chronic, stable.  Continue losartan 25 mg daily, amlodipine 10 mg daily

## 2024-08-06 NOTE — Assessment & Plan Note (Signed)
 Chronic, symptomatically stable.  Continue Crestor  5 mg daily.Pending lipid panel.

## 2024-08-09 ENCOUNTER — Other Ambulatory Visit: Payer: Self-pay | Admitting: Medical Genetics

## 2024-08-09 DIAGNOSIS — Z006 Encounter for examination for normal comparison and control in clinical research program: Secondary | ICD-10-CM

## 2024-08-15 ENCOUNTER — Ambulatory Visit: Payer: Self-pay | Admitting: Family

## 2024-08-18 ENCOUNTER — Other Ambulatory Visit: Payer: Self-pay | Admitting: Family

## 2024-08-18 DIAGNOSIS — I700 Atherosclerosis of aorta: Secondary | ICD-10-CM

## 2024-08-26 ENCOUNTER — Ambulatory Visit
Admission: RE | Admit: 2024-08-26 | Discharge: 2024-08-26 | Disposition: A | Discharge status: Home / Self Care | Payer: Self-pay | Source: Ambulatory Visit | Attending: Family | Admitting: Family

## 2024-08-26 DIAGNOSIS — I700 Atherosclerosis of aorta: Secondary | ICD-10-CM | POA: Insufficient documentation

## 2024-09-05 ENCOUNTER — Ambulatory Visit: Payer: Self-pay | Admitting: Family

## 2024-09-07 LAB — GENECONNECT MOLECULAR SCREEN

## 2024-09-08 ENCOUNTER — Telehealth: Payer: Self-pay | Admitting: Medical Genetics

## 2024-09-08 DIAGNOSIS — Z006 Encounter for examination for normal comparison and control in clinical research program: Secondary | ICD-10-CM

## 2024-09-08 NOTE — Telephone Encounter (Signed)
 [facility]  09/08/2024 9:43 AM  Confirmed I was speaking with Jill Arellano [id number] by using name and DOB. Informed participant the reason for this call is to follow-up on a recent sample the participant provided at one of the [facility] lab locations. Informed participant the test was not able to be completed with this sample and apologized for the inconvenience. Participant was requested to provide a new sample at one of our participating labs at no cost so that participant can continue participation and receive test results. Informed participant they do not need to be fasting and if there are other samples that need to be drawn, they can be done at the same visit. Participant has not had a blood transfusion or blood product in the last 30 days. Participant agreed to provide another sample. Participant was provided the [employer] program website to learn why this may have happened. Participant was thanked for their time and continued support of the above study.    Jordyn Pennstrom, BS [place]  [facility] Clinical Research Specialist II Direct Dial: [phone]  Fax: [phone]

## 2024-09-16 ENCOUNTER — Telehealth: Payer: Self-pay | Admitting: Family

## 2024-09-16 ENCOUNTER — Encounter: Payer: Self-pay | Admitting: Family

## 2024-09-16 ENCOUNTER — Ambulatory Visit: Admitting: Family

## 2024-09-16 DIAGNOSIS — I2510 Atherosclerotic heart disease of native coronary artery without angina pectoris: Secondary | ICD-10-CM

## 2024-09-16 MED ORDER — ROSUVASTATIN CALCIUM 10 MG PO TABS: 10.0000 mg | ORAL_TABLET | Freq: Every day | ORAL | 3 refills | Status: AC

## 2024-09-16 NOTE — Assessment & Plan Note (Addendum)
 Reviewed CT calcium  score with patient at length today.  Reviewed family history.  We jointly agreed to increase Crestor  from 5 mg to 10 mg daily to achieve LDL less than 70.  She has already started aspirin  81 mg daily.  Discussed risk of GI bleed, hemorrhagic CVA on aspirin .  At this time, we feel risk benefit of aspirin  outweighs risk.  Follow-up with cardiology as already scheduled, will follow. Of note, discussed incidental findings including patulous esophagus with small hiatal hernia.  Fortunately she is asymptomatic at this time.  Call out Dr. Kayla office to ensure this does not warrant any further surveillance or EGD in the absence of symptoms for dysmotility disorder.

## 2024-09-16 NOTE — Progress Notes (Signed)
 Assessment & Plan:  Coronary artery disease involving native heart without angina pectoris, unspecified vessel or lesion type Assessment & Plan: Reviewed CT calcium  score with patient at length today.  Reviewed family history.  We jointly agreed to increase Crestor  from 5 mg to 10 mg daily to achieve LDL less than 70.  She has already started aspirin  81 mg daily.  Discussed risk of GI bleed, hemorrhagic CVA on aspirin .  At this time, we feel risk benefit of aspirin  outweighs risk.  Follow-up with cardiology as already scheduled, will follow. Of note, discussed incidental findings including patulous esophagus with small hiatal hernia.  Fortunately she is asymptomatic at this time.  Call out Dr. Kayla office to ensure this does not warrant any further surveillance or EGD in the absence of symptoms for dysmotility disorder.  Orders: -     Rosuvastatin  Calcium ; Take 1 tablet (10 mg total) by mouth daily.  Dispense: 90 tablet; Refill: 3 -     Hepatic function panel -     Lipid panel     Return precautions given.   Risks, benefits, and alternatives of the medications and treatment plan prescribed today were discussed, and patient expressed understanding.   Education regarding symptom management and diagnosis given to patient on AVS either electronically or printed.  Return in about 6 weeks (around 10/28/2024).  Rollene Northern, FNP  Subjective:    Patient ID: Jill Arellano, female    DOB: [birth year], 73 y.o.   MRN: [id number]  CC: Jill Arellano is a 73 y.o. female who presents today for follow up.   HPI: HPI Discussed the use of AI scribe software for clinical note transcription with the patient, who gave verbal consent to proceed.  History of Present Illness   Jill Arellano is a 73 year old female with coronary artery disease who presents for review of her CT scan results and discussion of her elevated coronary calcium  score.  She recently underwent a CT scan that revealed  an elevated coronary calcium  score of 484. She has a significant family history of heart disease, with her mother and sister having heart disease, and another sister having had stents placed.   She is compliant with Crestor  5 mg daily  She has resumed taking low-dose aspirin  after consulting with her daughter, a publishing rights manager. Denies fatigue, chest pain, shortness of breath.   An incidental finding on the CT scan noted a patulous esophagus with a small hernia and scattered atelectasis and scarring, likely from her past smoking history. She quit smoking in 1999.   Denies cough, wheezing, dysphagia, epigastric pain, or unintentional weight loss.   She works at [employer] and describes a busy work environment, especially during the holiday season.         Former smoker Cardiac cath 08/28/2017 The left ventricular systolic function is normal. LV end diastolic pressure is normal. The left ventricular ejection fraction is greater than 65% by visual estimate.    Allergies: Patient has no known allergies. Current Outpatient Medications on File Prior to Visit  Medication Sig Dispense Refill   amLODipine  (NORVASC ) 10 MG tablet Take 1 tablet (10 mg total) by mouth daily. 90 tablet 3   APPLE CIDER VINEGAR PO Take by mouth daily.     aspirin  EC 81 MG tablet Take 81 mg by mouth daily. Swallow whole.     Calcium  Carbonate (CALCIUM  600 PO) Take by mouth daily at 12 noon.     cholecalciferol (VITAMIN D3) 10  MCG (400 UNIT) TABS tablet Take 800 Units by mouth daily.      losartan  (COZAAR ) 25 MG tablet Take 1 tablet (25 mg total) by mouth daily. 90 tablet 3   Omega-3 Fatty Acids (FISH OIL) 1000 MG CAPS Take by mouth daily.     No current facility-administered medications on file prior to visit.    Review of Systems  Constitutional:  Negative for chills, fever and unexpected weight change.  HENT:  Negative for congestion and trouble swallowing.   Respiratory:  Negative for cough, shortness  of breath and wheezing.   Cardiovascular:  Negative for chest pain, palpitations and leg swelling.  Gastrointestinal:  Negative for nausea and vomiting.      Objective:    BP 128/70   Pulse 65   Temp 98.4 F (36.9 C) (Oral)   Ht 5' 1 (1.549 m)   Wt 133 lb 6.4 oz (60.5 kg)   SpO2 99%   BMI 25.21 kg/m  BP Readings from Last 3 Encounters:  09/16/24 128/70  08/06/24 130/78  02/04/24 122/78   Wt Readings from Last 3 Encounters:  09/16/24 133 lb 6.4 oz (60.5 kg)  08/06/24 133 lb 3.2 oz (60.4 kg)  02/04/24 128 lb 3.2 oz (58.2 kg)    Physical Exam Vitals reviewed.  Constitutional:      Appearance: She is well-developed.  Eyes:     Conjunctiva/sclera: Conjunctivae normal.  Cardiovascular:     Rate and Rhythm: Normal rate and regular rhythm.     Pulses: Normal pulses.     Heart sounds: Normal heart sounds.  Pulmonary:     Effort: Pulmonary effort is normal.     Breath sounds: Normal breath sounds. No wheezing, rhonchi or rales.  Skin:    General: Skin is warm and dry.  Neurological:     Mental Status: She is alert.  Psychiatric:        Speech: Speech normal.        Behavior: Behavior normal.        Thought Content: Thought content normal.

## 2024-09-16 NOTE — Telephone Encounter (Signed)
 Call [facility] Dr unk Incidental finding on CT calcium  score 08/14/24 Patulous esophagus with small hiatal hernia   Patient denies trouble swallowing, pain with swallowing, epigastric pain  In the absence of symptoms of dysmotility, I just want to ensure patient does not require a follow-up with Dr. Unk or possible EGD  I can also send epic staff message if Dr. Unk will see

## 2024-09-17 NOTE — Telephone Encounter (Signed)
 Called [facility] they were closed will call back on Monday 12/8

## 2024-09-20 NOTE — Telephone Encounter (Signed)
 Sent staff message to dr unk as well

## 2024-09-20 NOTE — Telephone Encounter (Signed)
 Per Rollene she has heard from Dr Unk

## 2024-09-20 NOTE — Telephone Encounter (Signed)
 Pt needs a referral, she has not been seen @ [facility] or [facility] per Megan

## 2024-09-21 NOTE — Telephone Encounter (Signed)
-----   Message from Corinn Brooklyn sent at 09/20/2024 12:23 PM EST ----- Regarding: RE: She would not ----- Message ----- From: Dineen Rollene MATSU, FNP Sent: 09/20/2024  10:31 AM EST To: Corinn Jess Brooklyn, MD  Dr Brooklyn,  [facility] you are doing well.  I wanted your advice on incidental finding. You performed patients colonoscopy in 2023.  Incidental finding on CT calcium  score 08/14/24 Patulous esophagus with small hiatal hernia    Patient denies trouble swallowing, pain with swallowing, epigastric pain   In the absence of symptoms of dysmotility, I just want to ensure patient does not require a follow-up or possible EGD.  Regards,  Rollene

## 2024-09-23 NOTE — Progress Notes (Signed)
 Cardiology Office Note:    Date:  09/24/2024   ID:  Jill Arellano, DOB 07/07/1951, MRN [id number]  PCP:  Dineen Rollene MATSU, FNP   [facility] Providers Cardiologist:  Deatrice Cage, MD Cardiology APP:  Vivienne Lonni Ingle, NP     Referring MD: Dineen Rollene MATSU, FNP   Chief complaint: Annual follow-up     History of Present Illness:   Jill Arellano is a 73 y.o. female with a hx of coronary calcifications with normal coronary arteries, hypertension, HLD, tobacco abuse, pulmonary nodules presenting to the office for annual follow-up of chronic cardiovascular conditions.  Initially evaluated by cardiology in October 2018 following coronary calcifications on the surveillance CT scanning for pulmonary nodules.  C/o mild exertional dyspnea without chest pain. She underwent exercise treadmill testing, which was abnormal with 1 mm inferolateral ST segment depression with exercise. She subsequently underwent diagnostic catheterization which showed normal coronary arteries and normal LV function.  Blood pressure controlled on amlodipine  and losartan .  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  PCP performed CT cardiac scoring in November 2025, which shows mild diffuse bronchial wall thickening, seen in the setting of bronchitis, patulous esophagus with small hiatal hernia, aortic atherosclerosis, coronary calcium  score 484 (86 percentile for age, sex matched controls), CAC >300 and LAD, RCA.  Presents independently, doing well from a cardiovascular standpoint. She denies fatigue, chest pain, palpitations, dyspnea, orthopnea, n, v, dark/tarry/bloody stools, hematuria, dizziness, syncope, edema, weight gain.  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  Frequently takes stairs, on her feet for 12-hour shifts, without exacerbation of  cardiovascular symptoms.  Reports she does struggle with her diet, particularly the hot dogs served at her restaurant, as they are known to be some of the top rated in the state of  .  Reports her BPs are typically well-controlled at home, usually averaging in the mid 120s-130s systolic, which is a historically accurate reflection of all of the blood pressures we have on file for her this year.  States she feels as though her mildly elevated pressure today may be secondary to whitecoat hypertension.  Reports some anxiety around her recently elevated coronary artery calcium  score.  ROS:   Please see the history of present illness.    All other systems reviewed and are negative.     Past Medical History:  Diagnosis Date   Arthritis    hands   Chicken pox    Coronary artery calcification seen on CT scan    a. 05/2017 Cor Ca2+ noted on CT; b. 07/2017 ETT: 1mm inflat ST dep w/ exercise (5:22, HTN response); c. 08/2017 Cath: nl cors. EF 65%; d. 08/2024 Cardiac CT: Ca2+ = 484 (86th%'ile) - LM 0, LAD 349, LCX 0, RCA 136.   Diverticulosis    a. 10/2021 Colonoscopy - diverticulosis.   Hypertension    Vitamin D  deficiency    Wears dentures    upper    Past Surgical History:  Procedure Laterality Date   BREAST BIOPSY Left 04/09/2024   stereo bx asymmetry, ribbon marker, path pending   BREAST BIOPSY Left 04/09/2024   MM LT BREAST BX W LOC DEV 1ST LESION IMAGE BX SPEC STEREO GUIDE 04/09/2024 [facility]   CATARACT EXTRACTION, BILATERAL  07/2019   COLONOSCOPY WITH PROPOFOL  N/A 08/09/2019   Procedure: COLONOSCOPY WITH PROPOFOL ;  Surgeon: Janalyn Keene NOVAK, MD;  Location: [facility] ENDOSCOPY;  Service: Endoscopy;  Laterality: N/A;   COLONOSCOPY WITH PROPOFOL  N/A 10/22/2021   Procedure: COLONOSCOPY WITH PROPOFOL ;  Surgeon: Unk Corinn Skiff, MD;  Location: [facility] ENDOSCOPY;  Service: Endoscopy;  Laterality: N/A;   LEFT HEART CATH AND CORONARY ANGIOGRAPHY N/A 08/28/2017   Procedure: LEFT  HEART CATH AND CORONARY ANGIOGRAPHY;  Surgeon: Court Dorn PARAS, MD;  Location: [facility] INVASIVE CV LAB;  Service: Cardiovascular;  Laterality: N/A;   TUBAL LIGATION      Current Medications: Active Medications[1]   Allergies:   Patient has no known allergies.   Social History   Socioeconomic History   Marital status: Single    Spouse name: Not on file   Number of children: Not on file   Years of education: Not on file   Highest education level: Associate degree: academic program  Occupational History   Not on file  Tobacco Use   Smoking status: Former    Current packs/day: 0.00    Average packs/day: 2.0 packs/day for 44.0 years (88.0 ttl pk-yrs)    Types: Cigarettes    Start date: 53    Quit date: 2008    Years since quitting: 17.9   Smokeless tobacco: Former    Quit date: 08/10/1995  Vaping Use   Vaping status: Never Used  Substance and Sexual Activity   Alcohol use: Yes    Alcohol/week: 1.0 standard drink of alcohol    Types: 1 Standard drinks or equivalent per week    Comment: beer OCC   Drug use: No   Sexual activity: Never  Other Topics Concern   Not on file  Social History Narrative   Moved from [place] in 2013    Lives by herself   Pets: 2 dogs live inside   Children: 2, daughter (1) and son (69)    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    Works at [employer] 20-30 hrs a week for extra [employer]    Enjoys reading       sister passed away 07-May-2022; she was her best friend   Social Drivers of Health   Tobacco Use: Medium Risk (09/24/2024)   Patient History    Smoking Tobacco Use: Former    Smokeless Tobacco Use: Former    Passive Exposure: Not on Actuary Strain: Medium Risk (08/01/2024)   Overall Financial Resource Strain (CARDIA)    Difficulty of Paying Living Expenses: Somewhat hard  Food Insecurity: No Food Insecurity (08/01/2024)   Epic    Worried About Programme Researcher, Broadcasting/film/video in the Last Year: Never true    Ran Out of Food in the  Last Year: Never true  Transportation Needs: No Transportation Needs (08/01/2024)   Epic    Lack of Transportation (Medical): No    Lack of Transportation (Non-Medical): No  Physical Activity: Inactive (08/01/2024)   Exercise Vital Sign    Days of Exercise per Week: 0 days    Minutes of Exercise per Session: Not on file  Stress: No Stress Concern Present (08/01/2024)   [employer] of Occupational Health - Occupational Stress Questionnaire    Feeling of Stress: Not at all  Social Connections: Moderately Integrated (08/01/2024)   Social Connection and Isolation Panel    Frequency of Communication with Friends and Family: More than three times a week    Frequency of Social Gatherings with Friends and Family: More than three times a week  Attends Religious Services: More than 4 times per year    Active Member of Clubs or Organizations: Yes    Attends Banker Meetings: More than 4 times per year    Marital Status: Divorced  Depression (PHQ2-9): Low Risk (09/16/2024)   Depression (PHQ2-9)    PHQ-2 Score: 0  Alcohol Screen: Low Risk (01/27/2024)   Alcohol Screen    Last Alcohol Screening Score (AUDIT): 0  Housing: Low Risk (08/01/2024)   Epic    Unable to Pay for Housing in the Last Year: No    Number of Times Moved in the Last Year: 0    Homeless in the Last Year: No  Utilities: Not At Risk (01/27/2024)   AHC Utilities    Threatened with loss of utilities: No  Health Literacy: Not on file     Family History: The patient's family history includes Cancer in her sister; Colon cancer (age of onset: 44) in her brother; Heart disease in her mother and sister; Heart failure in her brother and mother; Osteoporosis in her mother; Suicidality in her grandchild. There is no history of Breast cancer.  EKGs/Labs/Other Studies Reviewed:    The following studies were reviewed today:  EKG Interpretation Date/Time:  Friday September 24 2024 08:09:38 EST Ventricular Rate:   56 PR Interval:  142 QRS Duration:  66 QT Interval:  404 QTC Calculation: 389 R Axis:   62  Text Interpretation: Sinus bradycardia When compared with ECG of 24-Sep-2023 08:10, No significant change was found Confirmed by Tayelor Osborne [phone]) on 09/24/2024 8:15:34 AM    Recent Labs: 08/06/2024: ALT 16; BUN 22; Creatinine, Ser 0.73; Hemoglobin 13.5; Platelets 232.0; Potassium 5.1; Sodium 138; TSH 1.86  Recent Lipid Panel    Component Value Date/Time   CHOL 244 (H) 08/06/2024 0829   TRIG 62.0 08/06/2024 0829   HDL 132.70 08/06/2024 0829   CHOLHDL 2 08/06/2024 0829   VLDL 12.4 08/06/2024 0829   LDLCALC 99 08/06/2024 0829   LDLDIRECT 113.0 07/26/2019 1423     Risk Assessment/Calculations:      HYPERTENSION CONTROL Vitals:   09/24/24 0801 09/24/24 0916  BP: (!) 140/50 (!) 146/58    The patient's blood pressure is elevated above target today.  In order to address the patient's elevated BP: Blood pressure will be monitored at home to determine if medication changes need to be made.; The blood pressure is usually elevated in clinic.  Blood pressures monitored at home have been optimal.; Follow up with general cardiology has been recommended.            Physical Exam:    VS:  BP (!) 146/58 (BP Location: Left Arm)   Pulse (!) 56   Ht 5' 1 (1.549 m)   Wt 135 lb 6.4 oz (61.4 kg)   SpO2 98%   BMI 25.58 kg/m        Wt Readings from Last 3 Encounters:  09/24/24 135 lb 6.4 oz (61.4 kg)  09/16/24 133 lb 6.4 oz (60.5 kg)  08/06/24 133 lb 3.2 oz (60.4 kg)     GEN:  Well nourished, well developed in no acute distress HEENT: Normal NECK: No carotid bruits CARDIAC:  S1-S2 normal, RRR, no murmurs, rubs, gallops RESPIRATORY:  Clear to auscultation without rales, wheezing or rhonchi  MUSCULOSKELETAL:  No edema; No deformity  SKIN: Warm and dry NEUROLOGIC:  Alert and oriented x 3 PSYCHIATRIC:  Normal affect       Assessment & Plan Coronary artery calcification seen  on CT scan 2018 surveillance chest CT for pulmonary nodules showed coronary calcifications. CT calcium  scoring in November 2025 showing a CAC score of 484, >300 and LAD, RCA EKG: Sinus bradycardia, 56 bpm, no significant change from prior studies Denies chest pain, SOB, DOE, orthopnea, near-syncope Continue aspirin  81 mg daily Reports her PCP just increased her Crestor  from 5 mg to 10 mg daily a couple weeks ago Primary hypertension BPs reported well-controlled at home Continue amlodipine  10 mg daily Continue losartan  25 mg daily Instructed to continue to monitor BP at home, if consistently elevated will call office in 1 week and adjust BP meds going forward Mixed hyperlipidemia LDL goal <70 Lipid panel 08/06/2024: Cholesterol 244, triglycerides 62, HDL 132, LDL 99 Crestor  recently increased to 10 mg, continue follow-up of this with PCP as scheduled Would have low threshold to increase crestor  to 40 mg daily considering smoking history, elevated CAC score, age, ASCVD risk of 20.9-->12.0% with optimization of risk factors.  Continue omega-3 fatty acid supplement Advised reducing hotdog intake Pulmonary nodules Managed by PCP Most recent annual lung cancer screening showing benign appearance  Disposition: Follow up in 1 year, or sooner if necessary.            Medication Adjustments/Labs and Tests Ordered: Current medicines are reviewed at length with the patient today.  Concerns regarding medicines are outlined above.  Orders Placed This Encounter  Procedures   EKG 12-Lead   No orders of the defined types were placed in this encounter.   Patient Instructions  Medication Instructions:  Your physician recommends that you continue on your current medications as directed. Please refer to the Current Medication list given to you today.   *If you need a refill on your cardiac medications before your next appointment, please call your pharmacy*  Lab Work: None ordered at this  time   Follow-Up: At [facility], you and your health needs are our priority.  As part of our continuing mission to provide you with exceptional heart care, our providers are all part of one team.  This team includes your primary Cardiologist (physician) and Advanced Practice Providers or APPs (Physician Assistants and Nurse Practitioners) who all work together to provide you with the care you need, when you need it.  Your next appointment:   1 year(s)  Provider:   You may see Deatrice Cage, MD or Lonni Meager, NP  We recommend signing up for the patient portal called MyChart.  Sign up information is provided on this After Visit Summary.  MyChart is used to connect with patients for Virtual Visits (Telemedicine).  Patients are able to view lab/test results, encounter notes, upcoming appointments, etc.  Non-urgent messages can be sent to your provider as well.   To learn more about what you can do with MyChart, go to [email or web address].   Other Instructions:  Keep BP log, call office if numbers consistently over 135/85  Reminder: call office with any new cardiac symptoms    Signed, Marjean Imperato E Feleshia Zundel, NP  09/24/2024 9:17 AM    [facility]     [1]  Current Meds  Medication Sig   amLODipine  (NORVASC ) 10 MG tablet Take 1 tablet (10 mg total) by mouth daily.   APPLE CIDER VINEGAR PO Take by mouth daily.   aspirin  EC 81 MG tablet Take 81 mg by mouth daily. Swallow whole.   Calcium  Carbonate (CALCIUM  600 PO) Take by mouth daily at 12 noon.   cholecalciferol (VITAMIN D3) 10 MCG (  400 UNIT) TABS tablet Take 800 Units by mouth daily.    losartan  (COZAAR ) 25 MG tablet Take 1 tablet (25 mg total) by mouth daily.   Omega-3 Fatty Acids (FISH OIL) 1000 MG CAPS Take by mouth daily.   rosuvastatin  (CRESTOR ) 10 MG tablet Take 1 tablet (10 mg total) by mouth daily.

## 2024-09-24 ENCOUNTER — Ambulatory Visit: Attending: Nurse Practitioner | Admitting: Nurse Practitioner

## 2024-09-24 ENCOUNTER — Other Ambulatory Visit
Admission: RE | Admit: 2024-09-24 | Discharge: 2024-09-24 | Disposition: A | Payer: Self-pay | Source: Ambulatory Visit | Attending: Medical Genetics | Admitting: Medical Genetics

## 2024-09-24 ENCOUNTER — Encounter: Payer: Self-pay | Admitting: Nurse Practitioner

## 2024-09-24 VITALS — BP 146/58 | HR 56 | Ht 61.0 in | Wt 135.4 lb

## 2024-09-24 DIAGNOSIS — I10 Essential (primary) hypertension: Secondary | ICD-10-CM

## 2024-09-24 DIAGNOSIS — I2510 Atherosclerotic heart disease of native coronary artery without angina pectoris: Secondary | ICD-10-CM

## 2024-09-24 DIAGNOSIS — R918 Other nonspecific abnormal finding of lung field: Secondary | ICD-10-CM | POA: Diagnosis not present

## 2024-09-24 DIAGNOSIS — Z006 Encounter for examination for normal comparison and control in clinical research program: Secondary | ICD-10-CM

## 2024-09-24 DIAGNOSIS — E782 Mixed hyperlipidemia: Secondary | ICD-10-CM | POA: Diagnosis not present

## 2024-09-24 NOTE — Assessment & Plan Note (Signed)
 BPs reported well-controlled at home Continue amlodipine  10 mg daily Continue losartan  25 mg daily Instructed to continue to monitor BP at home, if consistently elevated will call office in 1 week and adjust BP meds going forward

## 2024-09-24 NOTE — Patient Instructions (Signed)
 Medication Instructions:  Your physician recommends that you continue on your current medications as directed. Please refer to the Current Medication list given to you today.   *If you need a refill on your cardiac medications before your next appointment, please call your pharmacy*  Lab Work: None ordered at this time   Follow-Up: At [facility], you and your health needs are our priority.  As part of our continuing mission to provide you with exceptional heart care, our providers are all part of one team.  This team includes your primary Cardiologist (physician) and Advanced Practice Providers or APPs (Physician Assistants and Nurse Practitioners) who all work together to provide you with the care you need, when you need it.  Your next appointment:   1 year(s)  Provider:   You may see Deatrice Cage, MD or Lonni Meager, NP  We recommend signing up for the patient portal called MyChart.  Sign up information is provided on this After Visit Summary.  MyChart is used to connect with patients for Virtual Visits (Telemedicine).  Patients are able to view lab/test results, encounter notes, upcoming appointments, etc.  Non-urgent messages can be sent to your provider as well.   To learn more about what you can do with MyChart, go to [email or web address].   Other Instructions:  Keep BP log, call office if numbers consistently over 135/85  Reminder: call office with any new cardiac symptoms

## 2024-10-03 LAB — GENECONNECT MOLECULAR SCREEN: Genetic Analysis Overall Interpretation: NEGATIVE

## 2024-10-27 ENCOUNTER — Other Ambulatory Visit: Payer: Self-pay

## 2024-10-27 DIAGNOSIS — R899 Unspecified abnormal finding in specimens from other organs, systems and tissues: Secondary | ICD-10-CM

## 2024-10-28 ENCOUNTER — Other Ambulatory Visit

## 2024-10-28 ENCOUNTER — Ambulatory Visit: Payer: Self-pay | Admitting: Family

## 2024-10-28 DIAGNOSIS — R899 Unspecified abnormal finding in specimens from other organs, systems and tissues: Secondary | ICD-10-CM

## 2024-10-28 LAB — HEPATIC FUNCTION PANEL
ALT: 21 U/L (ref 3–35)
AST: 23 U/L (ref 5–37)
Albumin: 4.7 g/dL (ref 3.5–5.2)
Alkaline Phosphatase: 49 U/L (ref 39–117)
Bilirubin, Direct: 0.1 mg/dL (ref 0.1–0.3)
Total Bilirubin: 0.5 mg/dL (ref 0.2–1.2)
Total Protein: 6.9 g/dL (ref 6.0–8.3)

## 2024-10-28 LAB — LIPID PANEL
Cholesterol: 219 mg/dL — ABNORMAL HIGH (ref 28–200)
HDL: 125.5 mg/dL
LDL Cholesterol: 73 mg/dL (ref 10–99)
NonHDL: 93.84
Total CHOL/HDL Ratio: 2
Triglycerides: 104 mg/dL (ref 10.0–149.0)
VLDL: 20.8 mg/dL (ref 0.0–40.0)

## 2025-02-01 ENCOUNTER — Ambulatory Visit

## 2025-02-07 ENCOUNTER — Encounter: Admitting: Family
